# Patient Record
Sex: Female | Born: 1935
Health system: Southern US, Community
[De-identification: ages and names within clinical notes are randomized; demographics above are authoritative.]

## PROBLEM LIST (undated history)

## (undated) DIAGNOSIS — I10 Essential (primary) hypertension: Secondary | ICD-10-CM

## (undated) DIAGNOSIS — I480 Paroxysmal atrial fibrillation: Secondary | ICD-10-CM

## (undated) DIAGNOSIS — J449 Chronic obstructive pulmonary disease, unspecified: Secondary | ICD-10-CM

## (undated) DIAGNOSIS — E785 Hyperlipidemia, unspecified: Secondary | ICD-10-CM

## (undated) DIAGNOSIS — R42 Dizziness and giddiness: Secondary | ICD-10-CM

---

## 2019-06-15 ENCOUNTER — Ambulatory Visit: Payer: Self-pay | Attending: Internal Medicine

## 2019-06-15 DIAGNOSIS — Z20822 Contact with and (suspected) exposure to covid-19: Secondary | ICD-10-CM

## 2019-06-16 LAB — NOVEL CORONAVIRUS, NAA: SARS-CoV-2, NAA: NOT DETECTED

## 2019-06-16 LAB — SARS-COV-2, NAA 2 DAY TAT

## 2019-06-22 ENCOUNTER — Ambulatory Visit: Payer: Medicare Other | Attending: Internal Medicine

## 2019-06-22 DIAGNOSIS — Z23 Encounter for immunization: Secondary | ICD-10-CM

## 2019-06-22 NOTE — Progress Notes (Signed)
   Covid-19 Vaccination Clinic  Name:  Lindsey [name]    MRN: [id number] DOB: 1930/08/30  06/22/2019  Lindsey [name] was observed post Covid-19 immunization for 15 minutes .  During the observation period, she experienced an adverse reaction with the following symptoms:  nausea and Shivering.  Assessment : Time of assessment . Alert and oriented, Anxious and Shivering.No complaints of pain. Patient with son in law in attendance could not stop shaking said she felt cold, but did not fell cold. Moved patient closer to EMT for possible assistance.  EMS 1609 checked blood sugar 170, Blood pressure 178/96 manually,  HR 96 and temp 96.6.  Had a few dry heaves but no vomiting. EMS suggested she be evaluated at the hospital. Paramedic did arrive. Patient was trying to decide if she really wanted to. Felt if she could go home and get warm she would feel better. Lindsey [name] daughter arrived EMS explained the situation.  The daughter made a call to her sister who is a Engineer, civil (consulting), they both felt t was anxiety related.  The family decided to take patient home. The daughter who is a nurse will be checking on her.  The EMT reviewed with the daughter and son in law about calling 911 if there are any further symptoms.  Actions taken:  Vitals sign taken  EMS on site and hand off completed to Paramedic (Name [place]). VAERS form completed Allergy documented   Medications administered: No medication administered.  Disposition:Instructed to call 911 for trouble breathing, rapid heart rate, dizziness, swelling of tongue or throat. The Patient was provided with Vaccine Information Sheet and instruction to access the V-Safe system.  Transport with nurse in attendee to daughter car.  Immunizations Administered    Name Date Dose VIS Date Route   Pfizer COVID-19 Vaccine 06/22/2019  3:43 PM 0.3 mL 03/06/2019 Intramuscular   Manufacturer: [employer], [employer]   Lot: [id number]   NDC: [address]-7

## 2019-07-15 ENCOUNTER — Ambulatory Visit: Payer: Medicare Other

## 2019-09-30 ENCOUNTER — Emergency Department (HOSPITAL_COMMUNITY): Payer: Medicare Other

## 2019-09-30 ENCOUNTER — Other Ambulatory Visit: Payer: Self-pay

## 2019-09-30 ENCOUNTER — Emergency Department (HOSPITAL_COMMUNITY)
Admission: EM | Admit: 2019-09-30 | Discharge: 2019-09-30 | Disposition: A | Discharge status: Home / Self Care | Payer: Medicare Other | Attending: Emergency Medicine | Admitting: Emergency Medicine

## 2019-09-30 DIAGNOSIS — I10 Essential (primary) hypertension: Secondary | ICD-10-CM | POA: Diagnosis not present

## 2019-09-30 DIAGNOSIS — Y92009 Unspecified place in unspecified non-institutional (private) residence as the place of occurrence of the external cause: Secondary | ICD-10-CM | POA: Diagnosis not present

## 2019-09-30 DIAGNOSIS — Y999 Unspecified external cause status: Secondary | ICD-10-CM | POA: Insufficient documentation

## 2019-09-30 DIAGNOSIS — Z7982 Long term (current) use of aspirin: Secondary | ICD-10-CM | POA: Diagnosis not present

## 2019-09-30 DIAGNOSIS — Y939 Activity, unspecified: Secondary | ICD-10-CM | POA: Insufficient documentation

## 2019-09-30 DIAGNOSIS — N390 Urinary tract infection, site not specified: Secondary | ICD-10-CM | POA: Insufficient documentation

## 2019-09-30 DIAGNOSIS — W19XXXA Unspecified fall, initial encounter: Secondary | ICD-10-CM

## 2019-09-30 DIAGNOSIS — W2209XA Striking against other stationary object, initial encounter: Secondary | ICD-10-CM | POA: Diagnosis not present

## 2019-09-30 DIAGNOSIS — S0990XA Unspecified injury of head, initial encounter: Secondary | ICD-10-CM | POA: Diagnosis present

## 2019-09-30 LAB — CBC WITH DIFFERENTIAL/PLATELET
Abs Immature Granulocytes: 0.05 10*3/uL (ref 0.00–0.07)
Basophils Absolute: 0.1 10*3/uL (ref 0.0–0.1)
Basophils Relative: 1 %
Eosinophils Absolute: 0.2 10*3/uL (ref 0.0–0.5)
Eosinophils Relative: 2 %
HCT: 38.2 % (ref 36.0–46.0)
Hemoglobin: 12.5 g/dL (ref 12.0–15.0)
Immature Granulocytes: 1 %
Lymphocytes Relative: 25 %
Lymphs Abs: 2.4 10*3/uL (ref 0.7–4.0)
MCH: 29.4 pg (ref 26.0–34.0)
MCHC: 32.7 g/dL (ref 30.0–36.0)
MCV: 89.9 fL (ref 80.0–100.0)
Monocytes Absolute: 0.6 10*3/uL (ref 0.1–1.0)
Monocytes Relative: 6 %
Neutro Abs: 6.3 10*3/uL (ref 1.7–7.7)
Neutrophils Relative %: 65 %
Platelets: 272 10*3/uL (ref 150–400)
RBC: 4.25 MIL/uL (ref 3.87–5.11)
RDW: 12.9 % (ref 11.5–15.5)
WBC: 9.6 10*3/uL (ref 4.0–10.5)
nRBC: 0 % (ref 0.0–0.2)

## 2019-09-30 LAB — BASIC METABOLIC PANEL WITH GFR
Anion gap: 10 (ref 5–15)
BUN: 18 mg/dL (ref 8–23)
CO2: 24 mmol/L (ref 22–32)
Calcium: 9 mg/dL (ref 8.9–10.3)
Chloride: 106 mmol/L (ref 98–111)
Creatinine, Ser: 1.06 mg/dL — ABNORMAL HIGH (ref 0.44–1.00)
GFR calc Af Amer: 55 mL/min — ABNORMAL LOW
GFR calc non Af Amer: 48 mL/min — ABNORMAL LOW
Glucose, Bld: 148 mg/dL — ABNORMAL HIGH (ref 70–99)
Potassium: 3.6 mmol/L (ref 3.5–5.1)
Sodium: 140 mmol/L (ref 135–145)

## 2019-09-30 LAB — URINALYSIS, ROUTINE W REFLEX MICROSCOPIC
Bilirubin Urine: NEGATIVE
Glucose, UA: NEGATIVE mg/dL
Ketones, ur: NEGATIVE mg/dL
Nitrite: NEGATIVE
Protein, ur: NEGATIVE mg/dL
RBC / HPF: >50 RBC/hpf — ABNORMAL HIGH (ref 0–5)
Specific Gravity, Urine: 1.01 (ref 1.005–1.030)
WBC, UA: >50 WBC/hpf — ABNORMAL HIGH (ref 0–5)
pH: 5 (ref 5.0–8.0)

## 2019-09-30 MED ORDER — CEPHALEXIN 500 MG PO CAPS
500.0000 mg | ORAL_CAPSULE | Freq: Two times a day (BID) | ORAL | 0 refills | Status: AC
Start: 2019-09-30 — End: 2019-10-07

## 2019-09-30 MED ORDER — ACETAMINOPHEN 325 MG PO TABS
650.0000 mg | ORAL_TABLET | Freq: Once | ORAL | Status: AC
  Administered 2019-09-30: 650 mg via ORAL
  Filled 2019-09-30: qty 2

## 2019-09-30 NOTE — ED Notes (Signed)
Dr Judd Lien at beside.

## 2019-09-30 NOTE — ED Notes (Signed)
Pt. Stated while she was in CT they removed her necklace and did not give it back. This tech called CT and spoke with [place] in CT. [place] from CT states the only thing removed from the patient was the earrings which were given to the patient's daughter in the room.

## 2019-09-30 NOTE — ED Notes (Signed)
Pt returned for CT. Dtr at bedside

## 2019-09-30 NOTE — Discharge Instructions (Signed)
Take the antibiotics as directed.  We have sent your urine for culture.  If you need to be on a different antibiotic we will call you and let you know. Follow-up with your primary care provider. Continue your home medications as prescribed. Return to the ER for worsening headache, additional injuries or falls, if you develop chest pain, shortness of breath, leg swelling, numbness in arms or legs.

## 2019-09-30 NOTE — ED Notes (Signed)
Pt made aware of the need for urine.

## 2019-09-30 NOTE — ED Notes (Signed)
Pt has been discharged but had to wait on security to place a report on a lost necklace. Pt wheeled to car after report placed, pt in NAD at this time and departing with daughter

## 2019-09-30 NOTE — ED Triage Notes (Signed)
Pt BIB [facility] from [facility] assisted living. Another resident saw pt fall and hit head on wooden railing. Pt reports feeling dizzy before fall.

## 2019-09-30 NOTE — ED Provider Notes (Addendum)
[facility] EMERGENCY DEPARTMENT Provider Note   CSN: [id number] Arrival date & time: 09/30/19  1359     History Chief Complaint  Patient presents with  . Fall    Lindsey [name] is a 86 y.o. female with a past medical history of atrial fibrillation, hypertension, vertigo who presents to ED with a chief complaint of fall.  Patient states that she has vertigo at baseline which causes her to be dizzy.  States that she was ambulating with a walker, felt dizzy, fell and hit the back of her head.  She denies any loss of consciousness.  She reports headache to the back of her head where she struck her head on a wooden railing.  She denies any chest pain, abdominal pain, vomiting, vision changes, numbness in arms or legs, changes to gait.  States that she recently moved to [place] from "up [place]."  HPI     No past medical history on file.  There are no problems to display for this patient.   OB History   No obstetric history on file.     No family history on file.  Social History   Tobacco Use  . Smoking status: Not on file  Substance Use Topics  . Alcohol use: Not on file  . Drug use: Not on file    Home Medications Prior to Admission medications   Medication Sig Start Date End Date Taking? Authorizing Provider  aspirin 81 MG chewable tablet Chew 81 mg by mouth daily.   Yes [provider]  atorvastatin (LIPITOR) 10 MG tablet Take 10 mg by mouth daily.   Yes [provider]  diclofenac Sodium (VOLTAREN) 1 % GEL Apply 2 g topically daily as needed (pain).   Yes [provider]  docusate sodium (COLACE) 100 MG capsule Take 100 mg by mouth every other day.   Yes [provider]  DULoxetine (CYMBALTA) 60 MG capsule Take 60 mg by mouth daily.   Yes [provider]  furosemide (LASIX) 40 MG tablet Take 40 mg by mouth daily.   Yes [provider]  gabapentin (NEURONTIN) 100 MG capsule Take 100 mg by mouth  3 (three) times daily.   Yes [provider]  meclizine (ANTIVERT) 25 MG tablet Take 25 mg by mouth every 8 (eight) hours as needed (motion sickness).   Yes [provider]  metFORMIN (GLUCOPHAGE) 500 MG tablet Take 500 mg by mouth in the morning and at bedtime.   Yes [provider]  metoprolol succinate (TOPROL-XL) 100 MG 24 hr tablet Take 100 mg by mouth daily. Take with or immediately following a meal.   Yes [provider]  Multiple Vitamin (MULTIVITAMIN WITH MINERALS) TABS tablet Take 1 tablet by mouth daily.   Yes [provider]  pantoprazole (PROTONIX) 40 MG tablet Take 40 mg by mouth daily.   Yes [provider]  potassium chloride (KLOR-CON) 10 MEQ tablet Take 10 mEq by mouth every Monday, Wednesday, and Friday.   Yes [provider]  rivaroxaban (XARELTO) 20 MG TABS tablet Take 20 mg by mouth daily.   Yes [provider]  SUMAtriptan (IMITREX) 50 MG tablet Take 50 mg by mouth every 2 (two) hours as needed for migraine. May repeat in 2 hours if headache persists or recurs.   Yes [provider]  tamsulosin (FLOMAX) 0.4 MG CAPS capsule Take 0.4 mg by mouth daily.   Yes [provider]  tiZANidine (ZANAFLEX) 2 MG tablet Take  2 mg by mouth daily.   Yes [provider]  cephALEXin (KEFLEX) 500 MG capsule Take 1 capsule (500 mg total) by mouth 2 (two) times daily for 7 days. 09/30/19 10/07/19  Khaylee Mcevoy, Hillary Bow, PA-C    Allergies    Diazepam, Drug ingredient [monosodium glutamate], Iodinated diagnostic agents, Nitrofurantoin, Pineapple, Savella [milnacipran], Shellfish allergy, Sulfa antibiotics, and Tramadol  Review of Systems   Review of Systems  Constitutional: Negative for appetite change, chills and fever.  HENT: Negative for ear pain, rhinorrhea, sneezing and sore throat.   Eyes: Negative for photophobia and visual disturbance.  Respiratory: Negative for cough, chest tightness, shortness of  breath and wheezing.   Cardiovascular: Negative for chest pain and palpitations.  Gastrointestinal: Negative for abdominal pain, blood in stool, constipation, diarrhea, nausea and vomiting.  Genitourinary: Negative for dysuria, hematuria and urgency.  Musculoskeletal: Negative for myalgias.  Skin: Negative for rash.  Neurological: Positive for dizziness and headaches. Negative for weakness and light-headedness.    Physical Exam Updated Vital Signs BP (!) 180/100   Pulse (!) 57   Temp 98.5 F (36.9 C) (Oral)   Resp 19   Ht 5\' 7"  (1.702 m)   Wt 71.7 kg   SpO2 95%   BMI 24.75 kg/m   Physical Exam Vitals and nursing note reviewed.  Constitutional:      General: She is not in acute distress.    Appearance: She is well-developed.  HENT:     Head: Normocephalic.      Nose: Nose normal.  Eyes:     General: No scleral icterus.       Right eye: No discharge.        Left eye: No discharge.     Conjunctiva/sclera: Conjunctivae normal.     Pupils: Pupils are equal, round, and reactive to light.  Cardiovascular:     Rate and Rhythm: Normal rate and regular rhythm.     Heart sounds: Normal heart sounds. No murmur heard.  No friction rub. No gallop.   Pulmonary:     Effort: Pulmonary effort is normal. No respiratory distress.     Breath sounds: Normal breath sounds.  Abdominal:     General: Bowel sounds are normal. There is no distension.     Palpations: Abdomen is soft.     Tenderness: There is no abdominal tenderness. There is no guarding.  Musculoskeletal:        General: Normal range of motion.     Cervical back: Normal range of motion and neck supple.     Comments: No midline spinal tenderness present in lumbar, thoracic or cervical spine. No step-off palpated. No visible bruising, edema or temperature change noted. No objective signs of numbness present. No saddle anesthesia. 2+ DP pulses bilaterally. Sensation intact to light touch. Strength 5/5 in bilateral lower  extremities.  Skin:    General: Skin is warm and dry.     Findings: No rash.  Neurological:     General: No focal deficit present.     Mental Status: She is alert and oriented to person, place, and time.     Cranial Nerves: No cranial nerve deficit.     Motor: No abnormal muscle tone.     Coordination: Coordination normal.     ED Results / Procedures / Treatments   Labs (all labs ordered are listed, but only abnormal results are displayed) Labs Reviewed  BASIC METABOLIC PANEL - Abnormal; Notable for the following components:      Result Value  Glucose, Bld 148 (*)    Creatinine, Ser 1.06 (*)    GFR calc non Af Amer 48 (*)    GFR calc Af Amer 55 (*)    All other components within normal limits  URINALYSIS, ROUTINE W REFLEX MICROSCOPIC - Abnormal; Notable for the following components:   APPearance HAZY (*)    Hgb urine dipstick LARGE (*)    Leukocytes,Ua LARGE (*)    RBC / HPF >50 (*)    WBC, UA >50 (*)    Bacteria, UA RARE (*)    All other components within normal limits  URINE CULTURE  CBC WITH DIFFERENTIAL/PLATELET    EKG EKG Interpretation  Date/Time:  Wednesday September 30 2019 16:44:19 EDT Ventricular Rate:  109 PR Interval:    QRS Duration: 106 QT Interval:  328 QTC Calculation: 442 R Axis:   -13 Text Interpretation: Atrial fibrillation LVH with secondary repolarization abnormality No previous ECGs available Confirmed by Vanetta MuldersZackowski, Scott [phone]([id number]) on 09/30/2019 5:00:01 PM   Radiology CT Head Wo Contrast  Result Date: 09/30/2019 CLINICAL DATA:  Fall, blood thinners, hit head on railing EXAM: CT HEAD WITHOUT CONTRAST CT CERVICAL SPINE WITHOUT CONTRAST TECHNIQUE: Multidetector CT imaging of the head and cervical spine was performed following the standard protocol without intravenous contrast. Multiplanar CT image reconstructions of the cervical spine were also generated. COMPARISON:  None. FINDINGS: CT HEAD FINDINGS Brain: No evidence of acute infarction, hemorrhage,  hydrocephalus, extra-axial collection or mass lesion/mass effect. Periventricular and deep white matter hypodensity. Vascular: No hyperdense vessel or unexpected calcification. Skull: Normal. Negative for fracture or focal lesion. Sinuses/Orbits: No acute finding. Other: Soft tissue contusion of the right scalp vertex. CT CERVICAL SPINE FINDINGS Alignment: Normal. Skull base and vertebrae: No acute fracture. No primary bone lesion or focal pathologic process. Soft tissues and spinal canal: No prevertebral fluid or swelling. No visible canal hematoma. Disc levels: Moderate multilevel disc space height loss and osteophytosis. Upper chest: Negative. Other: None. IMPRESSION: 1. No acute intracranial pathology. Small-vessel white matter disease. 2. Soft tissue contusion of the right scalp vertex. 3. No fracture or static subluxation of the cervical spine. 4. Moderate multilevel disc degenerative disease of the cervical spine. Electronically Signed   By: Lauralyn PrimesAlex  Bibbey M.D.   On: 09/30/2019 14:59   CT Cervical Spine Wo Contrast  Result Date: 09/30/2019 CLINICAL DATA:  Fall, blood thinners, hit head on railing EXAM: CT HEAD WITHOUT CONTRAST CT CERVICAL SPINE WITHOUT CONTRAST TECHNIQUE: Multidetector CT imaging of the head and cervical spine was performed following the standard protocol without intravenous contrast. Multiplanar CT image reconstructions of the cervical spine were also generated. COMPARISON:  None. FINDINGS: CT HEAD FINDINGS Brain: No evidence of acute infarction, hemorrhage, hydrocephalus, extra-axial collection or mass lesion/mass effect. Periventricular and deep white matter hypodensity. Vascular: No hyperdense vessel or unexpected calcification. Skull: Normal. Negative for fracture or focal lesion. Sinuses/Orbits: No acute finding. Other: Soft tissue contusion of the right scalp vertex. CT CERVICAL SPINE FINDINGS Alignment: Normal. Skull base and vertebrae: No acute fracture. No primary bone lesion or  focal pathologic process. Soft tissues and spinal canal: No prevertebral fluid or swelling. No visible canal hematoma. Disc levels: Moderate multilevel disc space height loss and osteophytosis. Upper chest: Negative. Other: None. IMPRESSION: 1. No acute intracranial pathology. Small-vessel white matter disease. 2. Soft tissue contusion of the right scalp vertex. 3. No fracture or static subluxation of the cervical spine. 4. Moderate multilevel disc degenerative disease of the cervical spine. Electronically Signed  By: Lauralyn Primes M.D.   On: 09/30/2019 14:59   DG Pelvis Portable  Result Date: 09/30/2019 CLINICAL DATA:  Right hip and pelvic pain after fall. EXAM: PORTABLE PELVIS 1-2 VIEWS COMPARISON:  None. FINDINGS: The cortical margins of the bony pelvis are intact. No fracture. Pubic symphysis and sacroiliac joints are congruent. Both femoral heads are well-seated in the respective acetabula. Surgical hardware in the lower lumbar spine is partially included. IMPRESSION: No evidence of pelvic fracture. Electronically Signed   By: Narda Rutherford M.D.   On: 09/30/2019 18:10    Procedures Procedures (including critical care time)  Medications Ordered in ED Medications  acetaminophen (TYLENOL) tablet 650 mg (650 mg Oral Given 09/30/19 1836)    ED Course  I have reviewed the triage vital signs and the nursing notes.  Pertinent labs & imaging results that were available during my care of the patient were reviewed by me and considered in my medical decision making (see chart for details).    MDM Rules/Calculators/A&P                          86 year old female with a past medical history of atrial fibrillation presenting to the ED after fall that occurred prior to arrival.  States that she has vertigo at baseline.  She was feeling dizzy while she was ambulating with her walker prior to arrival, fell and hit the back of her head.  No loss of consciousness reported.  She has been having a headache  since then the area where she struck her head on a wooden railing.  She denies chest pain, vision changes, numbness in arms or legs or changes to gait.  Daughter at bedside states that she was recently diagnosed with atrial fibrillation and placed on Xarelto.  On exam patient is alert, oriented to self, place and situation.  She is hard of hearing but is able to read lips.  No tenderness of the C, T or L-spine midline.  Patient initially denying any pain to the bilateral hips.  No deformities noted.  Distal pulses are intact.  There is a hematoma to the back of her head where she is having pain.  CT of the head and cervical spine showed no acute abnormalities.  EKG shows A. fib.  BMP, CBC unremarkable.  Urinalysis does show evidence of a UTI with bacteria, leukocytes and pyuria.  Patient reports she has a history of frequent UTIs and would like to be treated.  On my recheck she is now complaining of right hip pain with transport.  No deformities again noted.  Pelvic x-ray showed no acute abnormalities.  Patient symptoms controlled here with Tylenol.  Daughter at bedside states that she is at her mental baseline.  Will discharge her home with treatment for UTI and instructions regarding following up on urine culture and head injury precautions.  Patient is comfortable with plan. Patient discussed with and seen by the attending, Dr. Judd Lien.  Patient is hemodynamically stable, in NAD, and able to ambulate in the ED. Evaluation does not show pathology that would require ongoing emergent intervention or inpatient treatment. I explained the diagnosis to the patient. Pain has been managed and has no complaints prior to discharge. Patient is comfortable with above plan and is stable for discharge at this time. All questions were answered prior to disposition. Strict return precautions for returning to the ED were discussed. Encouraged follow up with PCP.   An After Visit Summary  was printed and given to the  patient.   Portions of this note were generated with Scientist, clinical (histocompatibility and immunogenetics). Dictation errors may occur despite best attempts at proofreading.  Final Clinical Impression(s) / ED Diagnoses Final diagnoses:  Fall, initial encounter  Injury of head, initial encounter  Lower urinary tract infectious disease    Rx / DC Orders ED Discharge Orders         Ordered    cephALEXin (KEFLEX) 500 MG capsule  2 times daily     Discontinue  Reprint     09/30/19 1837               Dietrich Pates, PA-C 09/30/19 Reece Agar, MD 10/02/19 2308

## 2019-10-02 LAB — URINE CULTURE: Culture: >=100000 — AB

## 2019-10-03 ENCOUNTER — Telehealth: Payer: Self-pay | Admitting: Emergency Medicine

## 2019-10-03 NOTE — Telephone Encounter (Signed)
Post ED Visit - Positive Culture Follow-up  Culture report reviewed by antimicrobial stewardship pharmacist: [facility] []  , Pharm.D. []  Enzo Bi, Pharm.D., BCPS AQ-ID []  , Pharm.D., BCPS []  Celedonio Miyamoto, Pharm.D., BCPS []  [place], Garvin Fila.D., BCPS, AAHIVP []  , Pharm.D., BCPS, AAHIVP []  Georgina Pillion, PharmD, BCPS []  , PharmD, BCPS []  [place], PharmD, BCPS []  [address], PharmD []  , PharmD, BCPS [x]  Estella Husk, PharmD  Pharmacy Team []  Lysle Pearl, PharmD []  , PharmD []  Phillips Climes, PharmD []  , Rph []  Agapito Games) , PharmD []  Verlan Friends, PharmD []  , PharmD []  Mervyn Gay, PharmD []  , PharmD []  Vinnie Level, PharmD []  [facility], PharmD []  , PharmD []  Len Childs, PharmD   Positive urine culture Treated with Cephalexin, organism sensitive to the same and no further patient follow-up is required at this time.  10/03/2019, 12:38 PM

## 2019-11-15 ENCOUNTER — Emergency Department (HOSPITAL_COMMUNITY): Payer: Medicare Other

## 2019-11-15 ENCOUNTER — Other Ambulatory Visit: Payer: Self-pay

## 2019-11-15 ENCOUNTER — Emergency Department (HOSPITAL_COMMUNITY)
Admission: EM | Admit: 2019-11-15 | Discharge: 2019-11-15 | Disposition: A | Discharge status: Home / Self Care | Payer: Medicare Other | Attending: Emergency Medicine | Admitting: Emergency Medicine

## 2019-11-15 ENCOUNTER — Encounter (HOSPITAL_COMMUNITY): Payer: Self-pay

## 2019-11-15 DIAGNOSIS — Z20822 Contact with and (suspected) exposure to covid-19: Secondary | ICD-10-CM | POA: Insufficient documentation

## 2019-11-15 DIAGNOSIS — N390 Urinary tract infection, site not specified: Secondary | ICD-10-CM | POA: Insufficient documentation

## 2019-11-15 DIAGNOSIS — R531 Weakness: Secondary | ICD-10-CM | POA: Diagnosis present

## 2019-11-15 HISTORY — DX: Dizziness and giddiness: R42

## 2019-11-15 HISTORY — DX: Essential (primary) hypertension: I10

## 2019-11-15 LAB — URINALYSIS, ROUTINE W REFLEX MICROSCOPIC
Bilirubin Urine: NEGATIVE
Glucose, UA: NEGATIVE mg/dL
Ketones, ur: NEGATIVE mg/dL
Nitrite: NEGATIVE
Protein, ur: 30 mg/dL — AB
Specific Gravity, Urine: 1.012 (ref 1.005–1.030)
pH: 5 (ref 5.0–8.0)

## 2019-11-15 LAB — CBC
HCT: 33.1 % — ABNORMAL LOW (ref 36.0–46.0)
Hemoglobin: 11 g/dL — ABNORMAL LOW (ref 12.0–15.0)
MCH: 29 pg (ref 26.0–34.0)
MCHC: 33.2 g/dL (ref 30.0–36.0)
MCV: 87.3 fL (ref 80.0–100.0)
Platelets: 203 10*3/uL (ref 150–400)
RBC: 3.79 MIL/uL — ABNORMAL LOW (ref 3.87–5.11)
RDW: 14.3 % (ref 11.5–15.5)
WBC: 11.9 10*3/uL — ABNORMAL HIGH (ref 4.0–10.5)
nRBC: 0 % (ref 0.0–0.2)

## 2019-11-15 LAB — COMPREHENSIVE METABOLIC PANEL
ALT: 22 U/L (ref 0–44)
AST: 40 U/L (ref 15–41)
Albumin: 3.9 g/dL (ref 3.5–5.0)
Alkaline Phosphatase: 48 U/L (ref 38–126)
Anion gap: 13 (ref 5–15)
BUN: 35 mg/dL — ABNORMAL HIGH (ref 8–23)
CO2: 21 mmol/L — ABNORMAL LOW (ref 22–32)
Calcium: 8.5 mg/dL — ABNORMAL LOW (ref 8.9–10.3)
Chloride: 100 mmol/L (ref 98–111)
Creatinine, Ser: 1.12 mg/dL — ABNORMAL HIGH (ref 0.44–1.00)
GFR calc Af Amer: 51 mL/min — ABNORMAL LOW (ref >60)
GFR calc non Af Amer: 44 mL/min — ABNORMAL LOW (ref >60)
Glucose, Bld: 266 mg/dL — ABNORMAL HIGH (ref 70–99)
Potassium: 3.5 mmol/L (ref 3.5–5.1)
Sodium: 134 mmol/L — ABNORMAL LOW (ref 135–145)
Total Bilirubin: 1 mg/dL (ref 0.3–1.2)
Total Protein: 7.4 g/dL (ref 6.5–8.1)

## 2019-11-15 LAB — SARS CORONAVIRUS 2 BY RT PCR (HOSPITAL ORDER, PERFORMED IN ~~LOC~~ HOSPITAL LAB): SARS Coronavirus 2: NEGATIVE

## 2019-11-15 LAB — LACTIC ACID, PLASMA: Lactic Acid, Venous: 2.3 mmol/L (ref 0.5–1.9)

## 2019-11-15 MED ORDER — SODIUM CHLORIDE 0.9 % IV BOLUS
500.0000 mL | Freq: Once | INTRAVENOUS | Status: AC
  Administered 2019-11-15: 500 mL via INTRAVENOUS

## 2019-11-15 MED ORDER — SODIUM CHLORIDE 0.9 % IV BOLUS
1000.0000 mL | Freq: Once | INTRAVENOUS | Status: AC
  Administered 2019-11-15: 1000 mL via INTRAVENOUS

## 2019-11-15 MED ORDER — CEPHALEXIN 500 MG PO CAPS: 1000.0000 mg | ORAL_CAPSULE | Freq: Two times a day (BID) | ORAL | 0 refills | Status: DC

## 2019-11-15 MED ORDER — SODIUM CHLORIDE 0.9 % IV SOLN
1.0000 g | Freq: Once | INTRAVENOUS | Status: AC
  Administered 2019-11-15: 1 g via INTRAVENOUS
  Filled 2019-11-15: qty 10

## 2019-11-15 NOTE — ED Provider Notes (Signed)
[facility]-EMERGENCY DEPT Provider Note   CSN: [id number] Arrival date & time: 11/15/19  1334     History Chief Complaint  Patient presents with  . Weakness    Lindsey [name] is a 86 y.o. female.  Patient arrives to ED via EMS from [facility] with report patient generally weak, less alert/active than normal, occasional cough, non productive, and arrives with low grade fever, 100. No specific known covid exposure. Pt very limited historian - does not answer questions asked - level 5 caveat. No report of trauma or fall.   The history is provided by the patient and the EMS personnel. The history is limited by the condition of the patient.       Past Medical History:  Diagnosis Date  . Hypertension   . Vertigo     There are no problems to display for this patient.   History reviewed. No pertinent surgical history.   OB History   No obstetric history on file.     History reviewed. No pertinent family history.  Social History   Tobacco Use  . Smoking status: Not on file  Substance Use Topics  . Alcohol use: Not on file  . Drug use: Not on file    Home Medications Prior to Admission medications   Medication Sig Start Date End Date Taking? Authorizing Provider  aspirin 81 MG chewable tablet Chew 81 mg by mouth daily.    [provider]  atorvastatin (LIPITOR) 10 MG tablet Take 10 mg by mouth daily.    [provider]  diclofenac Sodium (VOLTAREN) 1 % GEL Apply 2 g topically daily as needed (pain).    [provider]  docusate sodium (COLACE) 100 MG capsule Take 100 mg by mouth every other day.    [provider]  DULoxetine (CYMBALTA) 60 MG capsule Take 60 mg by mouth daily.    [provider]  furosemide (LASIX) 40 MG tablet Take 40 mg by mouth daily.    [provider]  gabapentin (NEURONTIN) 100 MG capsule Take 100 mg by mouth 3 (three) times daily.    [provider]  meclizine  (ANTIVERT) 25 MG tablet Take 25 mg by mouth every 8 (eight) hours as needed (motion sickness).    [provider]  metFORMIN (GLUCOPHAGE) 500 MG tablet Take 500 mg by mouth in the morning and at bedtime.    [provider]  metoprolol succinate (TOPROL-XL) 100 MG 24 hr tablet Take 100 mg by mouth daily. Take with or immediately following a meal.    [provider]  Multiple Vitamin (MULTIVITAMIN WITH MINERALS) TABS tablet Take 1 tablet by mouth daily.    [provider]  pantoprazole (PROTONIX) 40 MG tablet Take 40 mg by mouth daily.    [provider]  potassium chloride (KLOR-CON) 10 MEQ tablet Take 10 mEq by mouth every Monday, Wednesday, and Friday.    [provider]  rivaroxaban (XARELTO) 20 MG TABS tablet Take 20 mg by mouth daily.    [provider]  SUMAtriptan (IMITREX) 50 MG tablet Take 50 mg by mouth every 2 (two) hours as needed for migraine. May repeat in 2 hours if headache persists or recurs.    [provider]  tamsulosin (FLOMAX) 0.4 MG CAPS capsule Take 0.4 mg by mouth daily.    [provider]  tiZANidine (ZANAFLEX) 2 MG tablet Take 2 mg by mouth daily.    [provider]    Allergies  Diazepam, Drug ingredient [monosodium glutamate], Iodinated diagnostic agents, Nitrofurantoin, Pineapple, Savella [milnacipran], Shellfish allergy, Sulfa antibiotics, and Tramadol  Review of Systems   Review of Systems  Unable to perform ROS: Mental status change  Allergic/Immunologic: Immunocompromised state:    level 5 caveat, dementia, altered mental status  Physical Exam Updated Vital Signs BP 105/66 (BP Location: Left Arm)   Pulse 99   Temp 100 F (37.8 C) (Rectal)   Resp 20   Ht 1.702 m (5\' 7" )   SpO2 98%   BMI 24.75 kg/m   Physical Exam Vitals and nursing note reviewed.  Constitutional:      Appearance: Normal appearance. She is well-developed.  HENT:     Head: Atraumatic.      Nose: Nose normal.     Mouth/Throat:     Mouth: Mucous membranes are moist.  Eyes:     General: No scleral icterus.    Conjunctiva/sclera: Conjunctivae normal.     Pupils: Pupils are equal, round, and reactive to light.  Neck:     Vascular: No carotid bruit.     Trachea: No tracheal deviation.     Comments: No stiffness or rigidity. Thyroid not grossly enlarged or tender. Trachea midline.  Cardiovascular:     Rate and Rhythm: Normal rate and regular rhythm.     Pulses: Normal pulses.     Heart sounds: Normal heart sounds. No murmur heard.  No friction rub. No gallop.   Pulmonary:     Effort: Pulmonary effort is normal. No respiratory distress.     Breath sounds: Normal breath sounds.  Abdominal:     General: Bowel sounds are normal. There is no distension.     Palpations: Abdomen is soft. There is no mass.     Tenderness: There is no abdominal tenderness. There is no guarding or rebound.     Hernia: No hernia is present.  Genitourinary:    Comments: No cva tenderness.  Musculoskeletal:        General: No swelling or tenderness.     Cervical back: Normal range of motion and neck supple. No rigidity or tenderness. No muscular tenderness.  Lymphadenopathy:     Cervical: No cervical adenopathy.  Skin:    General: Skin is warm and dry.     Findings: No rash.  Neurological:     Mental Status: She is alert.     Comments: Sitting upright. Eyes closed. Opens eyes to command. Moves bil extremities purposefully.   Psychiatric:     Comments: General weak, drowsy appearing.      ED Results / Procedures / Treatments   Labs (all labs ordered are listed, but only abnormal results are displayed) Results for orders placed or performed during the hospital encounter of 11/15/19  SARS Coronavirus 2 by RT PCR (hospital order, performed in [facility] hospital lab) Nasopharyngeal Nasopharyngeal Swab   Specimen: Nasopharyngeal Swab  Result Value Ref Range   SARS Coronavirus 2 NEGATIVE  NEGATIVE  CBC  Result Value Ref Range   WBC 11.9 (H) 4.0 - 10.5 K/uL   RBC 3.79 (L) 3.87 - 5.11 MIL/uL   Hemoglobin 11.0 (L) 12.0 - 15.0 g/dL   HCT [facility] (L) 36 - 46 %   MCV 87.3 80.0 - 100.0 fL   MCH 29.0 26.0 - 34.0 pg   MCHC 33.2 30.0 - 36.0 g/dL   RDW 36.1 44.3 - 15.4 %   Platelets 203 150 - 400 K/uL   nRBC 0.0 0.0 - 0.2 %  Comprehensive metabolic panel  Result Value Ref Range   Sodium 134 (L) 135 - 145 mmol/L   Potassium 3.5 3.5 - 5.1 mmol/L   Chloride 100 98 - 111 mmol/L   CO2 21 (L) 22 - 32 mmol/L   Glucose, Bld 266 (H) 70 - 99 mg/dL   BUN 35 (H) 8 - 23 mg/dL   Creatinine, Ser 1.611.12 (H) 0.44 - 1.00 mg/dL   Calcium 8.5 (L) 8.9 - 10.3 mg/dL   Total Protein 7.4 6.5 - 8.1 g/dL   Albumin 3.9 3.5 - 5.0 g/dL   AST 40 15 - 41 U/L   ALT 22 0 - 44 U/L   Alkaline Phosphatase 48 38 - 126 U/L   Total Bilirubin 1.0 0.3 - 1.2 mg/dL   GFR calc non Af Amer 44 (L) >60 mL/min   GFR calc Af Amer 51 (L) >60 mL/min   Anion gap 13 5 - 15  Urinalysis, Routine w reflex microscopic Urine, Catheterized  Result Value Ref Range   Color, Urine YELLOW YELLOW   APPearance CLEAR CLEAR   Specific Gravity, Urine 1.012 1.005 - 1.030   pH 5.0 5.0 - 8.0   Glucose, UA NEGATIVE NEGATIVE mg/dL   Hgb urine dipstick MODERATE (A) NEGATIVE   Bilirubin Urine NEGATIVE NEGATIVE   Ketones, ur NEGATIVE NEGATIVE mg/dL   Protein, ur 30 (A) NEGATIVE mg/dL   Nitrite NEGATIVE NEGATIVE   Leukocytes,Ua MODERATE (A) NEGATIVE   RBC / HPF 0-5 0 - 5 RBC/hpf   WBC, UA 21-50 0 - 5 WBC/hpf   Bacteria, UA MANY (A) NONE SEEN   Hyaline Casts, UA PRESENT    Granular Casts, UA PRESENT   Lactic acid, plasma  Result Value Ref Range   Lactic Acid, Venous 2.3 (HH) 0.5 - 1.9 mmol/L   DG Chest Port 1 View  Result Date: 11/15/2019 CLINICAL DATA:  Fever, lethargy EXAM: PORTABLE CHEST 1 VIEW COMPARISON:  None. FINDINGS: Single frontal view of the chest demonstrates an unremarkable cardiac silhouette. No airspace disease, effusion,  or pneumothorax. No acute bony abnormalities. IMPRESSION: 1. No acute intrathoracic process. Electronically Signed   By: Sharlet SalinaMichael  Brown M.D.   On: 11/15/2019 15:05    EKG None  [facility]  Result Date: 11/15/2019 CLINICAL DATA:  Fever, lethargy EXAM: PORTABLE CHEST 1 VIEW COMPARISON:  None. FINDINGS: Single frontal view of the chest demonstrates an unremarkable cardiac silhouette. No airspace disease, effusion, or pneumothorax. No acute bony abnormalities. IMPRESSION: 1. No acute intrathoracic process. Electronically Signed   By: Sharlet SalinaMichael  Brown M.D.   On: 11/15/2019 15:05    Procedures Procedures (including critical care time)  Medications Ordered in ED Medications - No data to display  ED Course  I have reviewed the triage vital signs and the nursing notes.  Pertinent labs & imaging results that were available during my care of the patient were reviewed by me and considered in my medical decision making (see chart for details).    MDM Rules/Calculators/A&P                         Labs sent. Cxr.   Reviewed nursing notes and prior charts for additional history.   Covid swab sent.  Marilynne DriversRosette Harm was evaluated in Emergency Department on 11/15/2019 for the symptoms described in the history of present illness. She was evaluated in the context of the global COVID-19 pandemic, which necessitated consideration that the patient might be at risk  for infection with the SARS-CoV-2 virus that causes COVID-19. Institutional protocols and algorithms that pertain to the evaluation of patients at risk for COVID-19 are in a state of rapid change based on information released by regulatory bodies including the CDC and federal and state organizations. These policies and algorithms were followed during the patient's care in the ED.  CXR reviewed/interpreted by me - no pna.  Initial labs reviewed/interpreted by me - lactate is sl high. Iv ns bolus. Wbc sl elev.   UA and covid  pending.   Additional labs reviewed/interpreted by me - covid is negative. UA positive for UTI - urine culture sent. Iv antibiotics given.   Recheck, pt alert, content, and appears in no acute distres. BP normal, Hr is 82, rr 18, pulse ox 98% (while in room with pt @ 1830). Abd soft nt. No vomiting.   Pt currently appears stable for d/c.   Rx provided.   Return precautions provided.     Final Clinical Impression(s) / ED Diagnoses Final diagnoses:  None    Rx / DC Orders ED Discharge Orders    None       Cathren Laine, MD 11/15/19 [phone]

## 2019-11-15 NOTE — ED Triage Notes (Signed)
Staff t her extended care facility and pt's. Family noted pt. To be "less responsive today". Pt. Arrives in no distress, awake and non-verbal. Her skin is slightly pale, warm and dry.

## 2019-11-15 NOTE — Discharge Instructions (Addendum)
It was our pleasure to provide your ER care today - we hope that you feel better.  Your lab work shows a urine infection - a urine culture was sent the results of which will be back in 1-2 days - have your doctor follow up on those results then.   Take antibiotic (keflex) as prescribed.   Drink plenty of fluids. Get adequate nutrition.   Follow up with primary care doctor in the next 1-2 days for recheck.   Return to ER if worse, new symptoms, increased trouble breathing, new or severe pain, persistent vomiting, or other concern.

## 2019-11-15 NOTE — ED Notes (Signed)
[facility] called and transport request made.

## 2019-11-15 NOTE — ED Notes (Signed)
She is aware of her situation, and now answers simple questions appropriately if they are spoken loudly enough.

## 2019-11-15 NOTE — ED Notes (Signed)
Mardene Celeste, daughter, [phone], would like a call when her mom gets in a room and she can visit.

## 2019-11-16 LAB — URINE CULTURE

## 2019-11-22 ENCOUNTER — Other Ambulatory Visit: Payer: Self-pay

## 2019-11-22 ENCOUNTER — Inpatient Hospital Stay (HOSPITAL_COMMUNITY)
Admission: EM | Admit: 2019-11-22 | Discharge: 2019-11-27 | DRG: 871 | Disposition: A | Discharge status: Skilled Nursing Facility | Payer: Medicare Other | Source: Skilled Nursing Facility | Attending: Internal Medicine | Admitting: Internal Medicine

## 2019-11-22 ENCOUNTER — Encounter (HOSPITAL_COMMUNITY): Payer: Self-pay | Admitting: *Deleted

## 2019-11-22 ENCOUNTER — Emergency Department (HOSPITAL_COMMUNITY): Payer: Medicare Other

## 2019-11-22 DIAGNOSIS — R0602 Shortness of breath: Secondary | ICD-10-CM

## 2019-11-22 DIAGNOSIS — E785 Hyperlipidemia, unspecified: Secondary | ICD-10-CM | POA: Diagnosis present

## 2019-11-22 DIAGNOSIS — Z91041 Radiographic dye allergy status: Secondary | ICD-10-CM

## 2019-11-22 DIAGNOSIS — Z20822 Contact with and (suspected) exposure to covid-19: Secondary | ICD-10-CM | POA: Diagnosis present

## 2019-11-22 DIAGNOSIS — R627 Adult failure to thrive: Secondary | ICD-10-CM | POA: Diagnosis not present

## 2019-11-22 DIAGNOSIS — Z888 Allergy status to other drugs, medicaments and biological substances status: Secondary | ICD-10-CM

## 2019-11-22 DIAGNOSIS — Z955 Presence of coronary angioplasty implant and graft: Secondary | ICD-10-CM

## 2019-11-22 DIAGNOSIS — E1165 Type 2 diabetes mellitus with hyperglycemia: Secondary | ICD-10-CM | POA: Diagnosis present

## 2019-11-22 DIAGNOSIS — J209 Acute bronchitis, unspecified: Secondary | ICD-10-CM | POA: Diagnosis not present

## 2019-11-22 DIAGNOSIS — Z91013 Allergy to seafood: Secondary | ICD-10-CM

## 2019-11-22 DIAGNOSIS — Z7982 Long term (current) use of aspirin: Secondary | ICD-10-CM

## 2019-11-22 DIAGNOSIS — Z886 Allergy status to analgesic agent status: Secondary | ICD-10-CM

## 2019-11-22 DIAGNOSIS — I252 Old myocardial infarction: Secondary | ICD-10-CM

## 2019-11-22 DIAGNOSIS — Z7984 Long term (current) use of oral hypoglycemic drugs: Secondary | ICD-10-CM

## 2019-11-22 DIAGNOSIS — I2510 Atherosclerotic heart disease of native coronary artery without angina pectoris: Secondary | ICD-10-CM | POA: Diagnosis present

## 2019-11-22 DIAGNOSIS — Z79899 Other long term (current) drug therapy: Secondary | ICD-10-CM

## 2019-11-22 DIAGNOSIS — R6510 Systemic inflammatory response syndrome (SIRS) of non-infectious origin without acute organ dysfunction: Secondary | ICD-10-CM | POA: Diagnosis present

## 2019-11-22 DIAGNOSIS — J440 Chronic obstructive pulmonary disease with acute lower respiratory infection: Secondary | ICD-10-CM | POA: Diagnosis present

## 2019-11-22 DIAGNOSIS — Z66 Do not resuscitate: Secondary | ICD-10-CM | POA: Diagnosis present

## 2019-11-22 DIAGNOSIS — I5043 Acute on chronic combined systolic (congestive) and diastolic (congestive) heart failure: Secondary | ICD-10-CM | POA: Diagnosis present

## 2019-11-22 DIAGNOSIS — R079 Chest pain, unspecified: Secondary | ICD-10-CM | POA: Diagnosis present

## 2019-11-22 DIAGNOSIS — Z87891 Personal history of nicotine dependence: Secondary | ICD-10-CM

## 2019-11-22 DIAGNOSIS — I429 Cardiomyopathy, unspecified: Secondary | ICD-10-CM | POA: Diagnosis present

## 2019-11-22 DIAGNOSIS — Z885 Allergy status to narcotic agent status: Secondary | ICD-10-CM

## 2019-11-22 DIAGNOSIS — Z7189 Other specified counseling: Secondary | ICD-10-CM

## 2019-11-22 DIAGNOSIS — R0600 Dyspnea, unspecified: Secondary | ICD-10-CM

## 2019-11-22 DIAGNOSIS — Z515 Encounter for palliative care: Secondary | ICD-10-CM

## 2019-11-22 DIAGNOSIS — F0390 Unspecified dementia without behavioral disturbance: Secondary | ICD-10-CM | POA: Diagnosis present

## 2019-11-22 DIAGNOSIS — Z91018 Allergy to other foods: Secondary | ICD-10-CM

## 2019-11-22 DIAGNOSIS — A419 Sepsis, unspecified organism: Secondary | ICD-10-CM | POA: Diagnosis not present

## 2019-11-22 DIAGNOSIS — I509 Heart failure, unspecified: Secondary | ICD-10-CM

## 2019-11-22 DIAGNOSIS — Z882 Allergy status to sulfonamides status: Secondary | ICD-10-CM

## 2019-11-22 DIAGNOSIS — Z7901 Long term (current) use of anticoagulants: Secondary | ICD-10-CM

## 2019-11-22 DIAGNOSIS — J441 Chronic obstructive pulmonary disease with (acute) exacerbation: Secondary | ICD-10-CM

## 2019-11-22 DIAGNOSIS — J9601 Acute respiratory failure with hypoxia: Secondary | ICD-10-CM | POA: Diagnosis present

## 2019-11-22 DIAGNOSIS — I4891 Unspecified atrial fibrillation: Secondary | ICD-10-CM | POA: Diagnosis present

## 2019-11-22 DIAGNOSIS — I110 Hypertensive heart disease with heart failure: Secondary | ICD-10-CM | POA: Diagnosis present

## 2019-11-22 DIAGNOSIS — N390 Urinary tract infection, site not specified: Secondary | ICD-10-CM | POA: Diagnosis not present

## 2019-11-22 DIAGNOSIS — I480 Paroxysmal atrial fibrillation: Secondary | ICD-10-CM

## 2019-11-22 HISTORY — DX: Hyperlipidemia, unspecified: E78.5

## 2019-11-22 HISTORY — DX: Paroxysmal atrial fibrillation: I48.0

## 2019-11-22 LAB — BASIC METABOLIC PANEL
Anion gap: 10 (ref 5–15)
BUN: 14 mg/dL (ref 8–23)
CO2: 24 mmol/L (ref 22–32)
Calcium: 9.2 mg/dL (ref 8.9–10.3)
Chloride: 103 mmol/L (ref 98–111)
Creatinine, Ser: 0.69 mg/dL (ref 0.44–1.00)
GFR calc Af Amer: >60 mL/min (ref >60)
GFR calc non Af Amer: >60 mL/min (ref >60)
Glucose, Bld: 180 mg/dL — ABNORMAL HIGH (ref 70–99)
Potassium: 3.8 mmol/L (ref 3.5–5.1)
Sodium: 137 mmol/L (ref 135–145)

## 2019-11-22 LAB — CBC
HCT: 35.2 % — ABNORMAL LOW (ref 36.0–46.0)
Hemoglobin: 11.1 g/dL — ABNORMAL LOW (ref 12.0–15.0)
MCH: 28 pg (ref 26.0–34.0)
MCHC: 31.5 g/dL (ref 30.0–36.0)
MCV: 88.9 fL (ref 80.0–100.0)
Platelets: 369 10*3/uL (ref 150–400)
RBC: 3.96 MIL/uL (ref 3.87–5.11)
RDW: 14 % (ref 11.5–15.5)
WBC: 11.3 10*3/uL — ABNORMAL HIGH (ref 4.0–10.5)
nRBC: 0 % (ref 0.0–0.2)

## 2019-11-22 LAB — TROPONIN I (HIGH SENSITIVITY): Troponin I (High Sensitivity): 41 ng/L — ABNORMAL HIGH (ref <18)

## 2019-11-22 NOTE — ED Triage Notes (Signed)
Pt arrives by EMS from [facility] [place] for CP and sob since today.  Pt is fully vaccinated. Pt has 324mg  asa, 1 sl nitro and has a 18g SL left forearm.

## 2019-11-23 ENCOUNTER — Encounter (HOSPITAL_COMMUNITY): Payer: Self-pay | Admitting: Internal Medicine

## 2019-11-23 ENCOUNTER — Inpatient Hospital Stay (HOSPITAL_COMMUNITY): Payer: Medicare Other

## 2019-11-23 DIAGNOSIS — I4811 Longstanding persistent atrial fibrillation: Secondary | ICD-10-CM | POA: Diagnosis not present

## 2019-11-23 DIAGNOSIS — I252 Old myocardial infarction: Secondary | ICD-10-CM | POA: Diagnosis not present

## 2019-11-23 DIAGNOSIS — R6510 Systemic inflammatory response syndrome (SIRS) of non-infectious origin without acute organ dysfunction: Secondary | ICD-10-CM

## 2019-11-23 DIAGNOSIS — I340 Nonrheumatic mitral (valve) insufficiency: Secondary | ICD-10-CM

## 2019-11-23 DIAGNOSIS — I110 Hypertensive heart disease with heart failure: Secondary | ICD-10-CM | POA: Diagnosis not present

## 2019-11-23 DIAGNOSIS — Z7984 Long term (current) use of oral hypoglycemic drugs: Secondary | ICD-10-CM | POA: Diagnosis not present

## 2019-11-23 DIAGNOSIS — Z66 Do not resuscitate: Secondary | ICD-10-CM | POA: Diagnosis not present

## 2019-11-23 DIAGNOSIS — E785 Hyperlipidemia, unspecified: Secondary | ICD-10-CM | POA: Diagnosis present

## 2019-11-23 DIAGNOSIS — Z20822 Contact with and (suspected) exposure to covid-19: Secondary | ICD-10-CM | POA: Diagnosis not present

## 2019-11-23 DIAGNOSIS — Z515 Encounter for palliative care: Secondary | ICD-10-CM | POA: Diagnosis not present

## 2019-11-23 DIAGNOSIS — I4891 Unspecified atrial fibrillation: Secondary | ICD-10-CM | POA: Diagnosis present

## 2019-11-23 DIAGNOSIS — F0390 Unspecified dementia without behavioral disturbance: Secondary | ICD-10-CM | POA: Diagnosis present

## 2019-11-23 DIAGNOSIS — I480 Paroxysmal atrial fibrillation: Secondary | ICD-10-CM | POA: Diagnosis not present

## 2019-11-23 DIAGNOSIS — Z7982 Long term (current) use of aspirin: Secondary | ICD-10-CM | POA: Diagnosis not present

## 2019-11-23 DIAGNOSIS — R0602 Shortness of breath: Secondary | ICD-10-CM | POA: Diagnosis present

## 2019-11-23 DIAGNOSIS — J209 Acute bronchitis, unspecified: Secondary | ICD-10-CM | POA: Diagnosis not present

## 2019-11-23 DIAGNOSIS — R079 Chest pain, unspecified: Secondary | ICD-10-CM

## 2019-11-23 DIAGNOSIS — N390 Urinary tract infection, site not specified: Secondary | ICD-10-CM | POA: Diagnosis not present

## 2019-11-23 DIAGNOSIS — Z79899 Other long term (current) drug therapy: Secondary | ICD-10-CM | POA: Diagnosis not present

## 2019-11-23 DIAGNOSIS — E1165 Type 2 diabetes mellitus with hyperglycemia: Secondary | ICD-10-CM | POA: Diagnosis not present

## 2019-11-23 DIAGNOSIS — A419 Sepsis, unspecified organism: Secondary | ICD-10-CM | POA: Diagnosis not present

## 2019-11-23 DIAGNOSIS — I351 Nonrheumatic aortic (valve) insufficiency: Secondary | ICD-10-CM | POA: Diagnosis not present

## 2019-11-23 DIAGNOSIS — Z7901 Long term (current) use of anticoagulants: Secondary | ICD-10-CM

## 2019-11-23 DIAGNOSIS — J9601 Acute respiratory failure with hypoxia: Secondary | ICD-10-CM | POA: Diagnosis not present

## 2019-11-23 DIAGNOSIS — I5021 Acute systolic (congestive) heart failure: Secondary | ICD-10-CM | POA: Diagnosis not present

## 2019-11-23 DIAGNOSIS — I5043 Acute on chronic combined systolic (congestive) and diastolic (congestive) heart failure: Secondary | ICD-10-CM | POA: Diagnosis not present

## 2019-11-23 DIAGNOSIS — J441 Chronic obstructive pulmonary disease with (acute) exacerbation: Secondary | ICD-10-CM | POA: Diagnosis not present

## 2019-11-23 DIAGNOSIS — I429 Cardiomyopathy, unspecified: Secondary | ICD-10-CM | POA: Diagnosis not present

## 2019-11-23 DIAGNOSIS — I509 Heart failure, unspecified: Secondary | ICD-10-CM

## 2019-11-23 DIAGNOSIS — R627 Adult failure to thrive: Secondary | ICD-10-CM | POA: Diagnosis not present

## 2019-11-23 DIAGNOSIS — Z955 Presence of coronary angioplasty implant and graft: Secondary | ICD-10-CM | POA: Diagnosis not present

## 2019-11-23 DIAGNOSIS — Z7189 Other specified counseling: Secondary | ICD-10-CM | POA: Diagnosis not present

## 2019-11-23 DIAGNOSIS — I2510 Atherosclerotic heart disease of native coronary artery without angina pectoris: Secondary | ICD-10-CM | POA: Diagnosis present

## 2019-11-23 DIAGNOSIS — Z87891 Personal history of nicotine dependence: Secondary | ICD-10-CM | POA: Diagnosis not present

## 2019-11-23 DIAGNOSIS — J440 Chronic obstructive pulmonary disease with acute lower respiratory infection: Secondary | ICD-10-CM | POA: Diagnosis not present

## 2019-11-23 LAB — URINALYSIS, ROUTINE W REFLEX MICROSCOPIC
Bilirubin Urine: NEGATIVE
Glucose, UA: NEGATIVE mg/dL
Hgb urine dipstick: NEGATIVE
Ketones, ur: NEGATIVE mg/dL
Leukocytes,Ua: NEGATIVE
Nitrite: NEGATIVE
Protein, ur: NEGATIVE mg/dL
Specific Gravity, Urine: 1.014 (ref 1.005–1.030)
pH: 5 (ref 5.0–8.0)

## 2019-11-23 LAB — HEMOGLOBIN A1C
Hgb A1c MFr Bld: 6.9 % — ABNORMAL HIGH (ref 4.8–5.6)
Mean Plasma Glucose: 151.33 mg/dL

## 2019-11-23 LAB — TYPE AND SCREEN
ABO/RH(D): B NEG
Antibody Screen: NEGATIVE

## 2019-11-23 LAB — ECHOCARDIOGRAM COMPLETE
Calc EF: 41.4 %
MV M vel: 5.23 m/s
MV Peak grad: 109.4 mmHg
P 1/2 time: 456 msec
Radius: 0.3 cm
S' Lateral: 3.6 cm
Single Plane A2C EF: 37.8 %
Single Plane A4C EF: 41.6 %

## 2019-11-23 LAB — HEPATITIS B SURFACE ANTIGEN: Hepatitis B Surface Ag: NONREACTIVE

## 2019-11-23 LAB — TSH: TSH: 0.991 u[IU]/mL (ref 0.350–4.500)

## 2019-11-23 LAB — TROPONIN I (HIGH SENSITIVITY): Troponin I (High Sensitivity): 45 ng/L — ABNORMAL HIGH (ref <18)

## 2019-11-23 LAB — LACTIC ACID, PLASMA: Lactic Acid, Venous: 1.3 mmol/L (ref 0.5–1.9)

## 2019-11-23 LAB — C-REACTIVE PROTEIN: CRP: 2.4 mg/dL — ABNORMAL HIGH (ref <1.0)

## 2019-11-23 LAB — CBG MONITORING, ED: Glucose-Capillary: 143 mg/dL — ABNORMAL HIGH (ref 70–99)

## 2019-11-23 LAB — FIBRINOGEN: Fibrinogen: 510 mg/dL — ABNORMAL HIGH (ref 210–475)

## 2019-11-23 LAB — FERRITIN: Ferritin: 65 ng/mL (ref 11–307)

## 2019-11-23 LAB — D-DIMER, QUANTITATIVE: D-Dimer, Quant: 0.57 ug/mL-FEU — ABNORMAL HIGH (ref 0.00–0.50)

## 2019-11-23 LAB — GLUCOSE, CAPILLARY
Glucose-Capillary: 192 mg/dL — ABNORMAL HIGH (ref 70–99)
Glucose-Capillary: 195 mg/dL — ABNORMAL HIGH (ref 70–99)

## 2019-11-23 LAB — BRAIN NATRIURETIC PEPTIDE: B Natriuretic Peptide: 687.3 pg/mL — ABNORMAL HIGH (ref 0.0–100.0)

## 2019-11-23 LAB — PROCALCITONIN: Procalcitonin: <0.1 ng/mL

## 2019-11-23 LAB — LACTATE DEHYDROGENASE: LDH: 182 U/L (ref 98–192)

## 2019-11-23 LAB — SARS CORONAVIRUS 2 BY RT PCR (HOSPITAL ORDER, PERFORMED IN ~~LOC~~ HOSPITAL LAB): SARS Coronavirus 2: NEGATIVE

## 2019-11-23 LAB — ABO/RH: ABO/RH(D): B NEG

## 2019-11-23 MED ORDER — POTASSIUM CHLORIDE CRYS ER 10 MEQ PO TBCR
10.0000 meq | EXTENDED_RELEASE_TABLET | ORAL | Status: DC
  Administered 2019-11-23: 10 meq via ORAL
  Filled 2019-11-23 (×2): qty 1

## 2019-11-23 MED ORDER — HYDRALAZINE HCL 20 MG/ML IJ SOLN: 10.0000 mg | INTRAMUSCULAR | Status: DC | PRN

## 2019-11-23 MED ORDER — SUMATRIPTAN SUCCINATE 50 MG PO TABS: 50.0000 mg | ORAL_TABLET | ORAL | Status: DC | PRN

## 2019-11-23 MED ORDER — INSULIN ASPART 100 UNIT/ML ~~LOC~~ SOLN
0.0000 [IU] | Freq: Three times a day (TID) | SUBCUTANEOUS | Status: DC
  Administered 2019-11-23 – 2019-11-24 (×2): 1 [IU] via SUBCUTANEOUS
  Administered 2019-11-24: 3 [IU] via SUBCUTANEOUS
  Administered 2019-11-24: 1 [IU] via SUBCUTANEOUS
  Administered 2019-11-25: 4 [IU] via SUBCUTANEOUS
  Administered 2019-11-25 (×2): 3 [IU] via SUBCUTANEOUS
  Administered 2019-11-26: 2 [IU] via SUBCUTANEOUS
  Administered 2019-11-26: 4 [IU] via SUBCUTANEOUS
  Administered 2019-11-26 – 2019-11-27 (×2): 2 [IU] via SUBCUTANEOUS

## 2019-11-23 MED ORDER — SODIUM CHLORIDE 0.9% FLUSH
3.0000 mL | Freq: Two times a day (BID) | INTRAVENOUS | Status: DC
  Administered 2019-11-23 – 2019-11-27 (×8): 3 mL via INTRAVENOUS

## 2019-11-23 MED ORDER — ADULT MULTIVITAMIN W/MINERALS CH
1.0000 | ORAL_TABLET | Freq: Every morning | ORAL | Status: DC
  Administered 2019-11-24 – 2019-11-27 (×4): 1 via ORAL
  Filled 2019-11-23 (×4): qty 1

## 2019-11-23 MED ORDER — DICLOFENAC SODIUM 1 % EX GEL
2.0000 g | Freq: Every day | CUTANEOUS | Status: DC | PRN
  Administered 2019-11-23: 21:00:00 2 g via TOPICAL
  Filled 2019-11-23: qty 100

## 2019-11-23 MED ORDER — RIVAROXABAN 20 MG PO TABS
20.0000 mg | ORAL_TABLET | Freq: Every day | ORAL | Status: DC
  Administered 2019-11-23 – 2019-11-25 (×3): 20 mg via ORAL
  Filled 2019-11-23 (×3): qty 1

## 2019-11-23 MED ORDER — MECLIZINE HCL 25 MG PO TABS
12.5000 mg | ORAL_TABLET | Freq: Every day | ORAL | Status: DC
  Administered 2019-11-24 – 2019-11-27 (×4): 12.5 mg via ORAL
  Filled 2019-11-23 (×4): qty 1

## 2019-11-23 MED ORDER — DOCUSATE SODIUM 100 MG PO CAPS
100.0000 mg | ORAL_CAPSULE | ORAL | Status: DC
  Administered 2019-11-23 – 2019-11-27 (×3): 100 mg via ORAL
  Filled 2019-11-23 (×3): qty 1

## 2019-11-23 MED ORDER — MECLIZINE HCL 25 MG PO TABS: 12.5000 mg | ORAL_TABLET | Freq: Every evening | ORAL | Status: DC | PRN

## 2019-11-23 MED ORDER — ACETAMINOPHEN 325 MG PO TABS
650.0000 mg | ORAL_TABLET | Freq: Four times a day (QID) | ORAL | Status: DC | PRN
  Administered 2019-11-23 – 2019-11-26 (×2): 650 mg via ORAL
  Filled 2019-11-23 (×2): qty 2

## 2019-11-23 MED ORDER — PANTOPRAZOLE SODIUM 40 MG PO TBEC
40.0000 mg | DELAYED_RELEASE_TABLET | Freq: Every morning | ORAL | Status: DC
  Administered 2019-11-24 – 2019-11-27 (×4): 40 mg via ORAL
  Filled 2019-11-23 (×4): qty 1

## 2019-11-23 MED ORDER — GABAPENTIN 100 MG PO CAPS
100.0000 mg | ORAL_CAPSULE | Freq: Three times a day (TID) | ORAL | Status: DC
  Administered 2019-11-23 – 2019-11-27 (×11): 100 mg via ORAL
  Filled 2019-11-23 (×11): qty 1

## 2019-11-23 MED ORDER — DULOXETINE HCL 60 MG PO CPEP
60.0000 mg | ORAL_CAPSULE | Freq: Every day | ORAL | Status: DC
  Administered 2019-11-23 – 2019-11-27 (×5): 60 mg via ORAL
  Filled 2019-11-23 (×5): qty 1

## 2019-11-23 MED ORDER — ACETAMINOPHEN 650 MG RE SUPP: 650.0000 mg | Freq: Four times a day (QID) | RECTAL | Status: DC | PRN

## 2019-11-23 MED ORDER — FUROSEMIDE 10 MG/ML IJ SOLN: 40.0000 mg | Freq: Every day | INTRAMUSCULAR | Status: DC

## 2019-11-23 MED ORDER — ASPIRIN 81 MG PO CHEW
81.0000 mg | CHEWABLE_TABLET | Freq: Every day | ORAL | Status: DC
  Administered 2019-11-23 – 2019-11-27 (×5): 81 mg via ORAL
  Filled 2019-11-23 (×5): qty 1

## 2019-11-23 MED ORDER — FUROSEMIDE 10 MG/ML IJ SOLN
40.0000 mg | Freq: Two times a day (BID) | INTRAMUSCULAR | Status: DC
  Administered 2019-11-23: 40 mg via INTRAVENOUS
  Filled 2019-11-23: qty 4

## 2019-11-23 MED ORDER — POTASSIUM CHLORIDE CRYS ER 20 MEQ PO TBCR
40.0000 meq | EXTENDED_RELEASE_TABLET | ORAL | Status: AC
  Administered 2019-11-23: 40 meq via ORAL
  Filled 2019-11-23: qty 2

## 2019-11-23 MED ORDER — ONDANSETRON HCL 4 MG/2ML IJ SOLN: 4.0000 mg | Freq: Four times a day (QID) | INTRAMUSCULAR | Status: DC | PRN

## 2019-11-23 MED ORDER — ALBUTEROL SULFATE (2.5 MG/3ML) 0.083% IN NEBU: 2.5000 mg | INHALATION_SOLUTION | Freq: Four times a day (QID) | RESPIRATORY_TRACT | Status: DC | PRN

## 2019-11-23 MED ORDER — TAMSULOSIN HCL 0.4 MG PO CAPS
0.4000 mg | ORAL_CAPSULE | Freq: Every morning | ORAL | Status: DC
  Administered 2019-11-24 – 2019-11-27 (×4): 0.4 mg via ORAL
  Filled 2019-11-23 (×4): qty 1

## 2019-11-23 MED ORDER — METOPROLOL SUCCINATE ER 100 MG PO TB24
100.0000 mg | ORAL_TABLET | Freq: Every day | ORAL | Status: DC
  Administered 2019-11-23 – 2019-11-24 (×2): 100 mg via ORAL
  Filled 2019-11-23 (×2): qty 1

## 2019-11-23 MED ORDER — ONDANSETRON HCL 4 MG PO TABS: 4.0000 mg | ORAL_TABLET | Freq: Four times a day (QID) | ORAL | Status: DC | PRN

## 2019-11-23 MED ORDER — ATORVASTATIN CALCIUM 10 MG PO TABS
10.0000 mg | ORAL_TABLET | Freq: Every day | ORAL | Status: DC
  Administered 2019-11-23 – 2019-11-27 (×5): 10 mg via ORAL
  Filled 2019-11-23 (×5): qty 1

## 2019-11-23 NOTE — ED Provider Notes (Signed)
[facility] EMERGENCY DEPARTMENT Provider Note   CSN: [id number] Arrival date & time: 11/22/19  1810     History Chief Complaint  Patient presents with  . Chest Pain    Lindsey [name] is a 86 y.o. female.  Patient presents to the ED with a chief complaint of SOB x 3 days.  She is coming from [place] via EMS.  She also complains of intermittent episodes of chest pain.  She has documented hx of hypertension and vertigo, but is noted to be on xarelto and has EKG showing a-fib.  She reports she has had some fever, but also reports that she is being treated for UTI.  She is DNR. Patient was given 324 of aspirin and one nitroglycerin.  The history is provided by the patient. No language interpreter was used.       Past Medical History:  Diagnosis Date  . Hypertension   . Vertigo     There are no problems to display for this patient.   History reviewed. No pertinent surgical history.   OB History   No obstetric history on file.     No family history on file.  Social History   Tobacco Use  . Smoking status: Not on file  Substance Use Topics  . Alcohol use: Not on file  . Drug use: Not on file    Home Medications Prior to Admission medications   Medication Sig Start Date End Date Taking? Authorizing Provider  aspirin 81 MG chewable tablet Chew 81 mg by mouth daily.    [provider]  atorvastatin (LIPITOR) 10 MG tablet Take 10 mg by mouth daily.    [provider]  cephALEXin (KEFLEX) 500 MG capsule Take 2 capsules (1,000 mg total) by mouth 2 (two) times daily. 11/15/19   Cathren Laine, MD  diclofenac Sodium (VOLTAREN) 1 % GEL Apply 2 g topically daily as needed (pain).    [provider]  docusate sodium (COLACE) 100 MG capsule Take 100 mg by mouth every other day.    [provider]  DULoxetine (CYMBALTA) 60 MG capsule Take 60 mg by mouth daily.    [provider]  furosemide (LASIX) 40 MG tablet  Take 40 mg by mouth daily.    [provider]  gabapentin (NEURONTIN) 100 MG capsule Take 100 mg by mouth 3 (three) times daily.    [provider]  meclizine (ANTIVERT) 25 MG tablet Take 25 mg by mouth every 8 (eight) hours as needed (motion sickness).    [provider]  metFORMIN (GLUCOPHAGE) 500 MG tablet Take 500 mg by mouth in the morning and at bedtime.    [provider]  metoprolol succinate (TOPROL-XL) 100 MG 24 hr tablet Take 100 mg by mouth daily. Take with or immediately following a meal.    [provider]  Multiple Vitamin (MULTIVITAMIN WITH MINERALS) TABS tablet Take 1 tablet by mouth daily.    [provider]  pantoprazole (PROTONIX) 40 MG tablet Take 40 mg by mouth daily.    [provider]  potassium chloride (KLOR-CON) 10 MEQ tablet Take 10 mEq by mouth every Monday, Wednesday, and Friday.    [provider]  rivaroxaban (XARELTO) 20 MG TABS tablet Take 20 mg by mouth daily.    [provider]  SUMAtriptan (IMITREX) 50 MG tablet Take 50 mg by mouth every 2 (two) hours as needed for migraine. May repeat in 2 hours if headache persists or recurs.  [provider]  tamsulosin (FLOMAX) 0.4 MG CAPS capsule Take 0.4 mg by mouth daily.    [provider]  tiZANidine (ZANAFLEX) 2 MG tablet Take 2 mg by mouth daily.    [provider]    Allergies    Diazepam, Drug ingredient [monosodium glutamate], Iodinated diagnostic agents, Nitrofurantoin, Pineapple, Savella [milnacipran], Shellfish allergy, Sulfa antibiotics, and Tramadol  Review of Systems   Review of Systems  All other systems reviewed and are negative.   Physical Exam Updated Vital Signs BP (!) 144/82 (BP Location: Right Arm)   Pulse 92   Temp 98.5 F (36.9 C) (Oral)   Resp 20   SpO2 99%   Physical Exam Vitals and nursing note reviewed.  Constitutional:      General: She is not in acute distress.     Appearance: She is well-developed.  HENT:     Head: Normocephalic and atraumatic.  Eyes:     Conjunctiva/sclera: Conjunctivae normal.  Cardiovascular:     Rate and Rhythm: Regular rhythm. Tachycardia present.     Heart sounds: No murmur heard.   Pulmonary:     Effort: Pulmonary effort is normal. No respiratory distress.     Breath sounds: Normal breath sounds. No stridor. No wheezing, rhonchi or rales.  Chest:     Chest wall: No tenderness.  Abdominal:     Palpations: Abdomen is soft.     Tenderness: There is no abdominal tenderness.  Musculoskeletal:        General: Normal range of motion.     Cervical back: Neck supple.  Skin:    General: Skin is warm and dry.  Neurological:     Mental Status: She is alert and oriented to person, place, and time.  Psychiatric:        Mood and Affect: Mood normal.        Behavior: Behavior normal.     ED Results / Procedures / Treatments   Labs (all labs ordered are listed, but only abnormal results are displayed) Labs Reviewed  BASIC METABOLIC PANEL - Abnormal; Notable for the following components:      Result Value   Glucose, Bld 180 (*)    All other components within normal limits  CBC - Abnormal; Notable for the following components:   WBC 11.3 (*)    Hemoglobin 11.1 (*)    HCT 35.2 (*)    All other components within normal limits  TROPONIN I (HIGH SENSITIVITY) - Abnormal; Notable for the following components:   Troponin I (High Sensitivity) 41 (*)    All other components within normal limits  TROPONIN I (HIGH SENSITIVITY) - Abnormal; Notable for the following components:   Troponin I (High Sensitivity) 45 (*)    All other components within normal limits  SARS CORONAVIRUS 2 BY RT PCR (HOSPITAL ORDER, PERFORMED IN [facility])  URINALYSIS, ROUTINE W REFLEX MICROSCOPIC  PROCALCITONIN  LACTATE DEHYDROGENASE  HEPATITIS B SURFACE ANTIGEN  FERRITIN  FIBRINOGEN  D-DIMER, QUANTITATIVE (NOT AT [facility])  C-REACTIVE  PROTEIN  BRAIN NATRIURETIC PEPTIDE  ABO/RH  TYPE AND SCREEN    EKG EKG Interpretation  Date/Time:  Sunday November 22 2019 18:41:44 EDT Ventricular Rate:  106 PR Interval:    QRS Duration: 102 QT Interval:  350 QTC Calculation: 464 R Axis:   -20 Text Interpretation: Atrial fibrillation with rapid ventricular response Minimal voltage criteria for LVH, may be normal variant ( Cornell product ) Septal infarct , age undetermined Confirmed by Nicanor Alcon, April (  [id number]) on 11/23/2019 3:31:06 AM   Radiology DG Chest 2 View  Result Date: 11/22/2019 CLINICAL DATA:  Severe lower mid chest pain for 1 day, difficulty breathing EXAM: CHEST - 2 VIEW COMPARISON:  11/15/2019 FINDINGS: Frontal and lateral views of the chest demonstrate a stable cardiac silhouette allowing for differences in positioning and technique. No acute airspace disease, effusion, or pneumothorax. Stable atherosclerosis of the aorta. No acute bony abnormalities. IMPRESSION: 1. No acute intrathoracic process. Electronically Signed   By: Sharlet Salina M.D.   On: 11/22/2019 19:26    Procedures Procedures (including critical care time)  Medications Ordered in ED Medications - No data to display  ED Course  I have reviewed the triage vital signs and the nursing notes.  Pertinent labs & imaging results that were available during my care of the patient were reviewed by me and considered in my medical decision making (see chart for details).    MDM Rules/Calculators/A&P                          This patient complains of chest pain and SOB, this involves an extensive number of treatment options, and is a complaint that carries with it a high risk of complications and morbidity.    Differential Dx ACS, PE, covid, pneumonia  Pertinent Labs I ordered, reviewed, and interpreted labs, which included troponin is 41 initially and increases to 45 as repeat. D-dimer is slightly elevated at 0.57, but age-adjusted, this is reassuring.  Additionally patient is on Xarelto, I doubt PE. Patient has mildly elevated white blood cell count at 11.3. She is currently being treated with Keflex for UTI.  Imaging Interpretation I ordered imaging studies which included chest x-ray.  I independently visualized and interpreted the chest x-ray, which showed no apparent infiltrate.   Sources Previous records obtained and reviewed previous documented on EKG with A. Fib.  Reassessments After the interventions stated above, I reevaluated the patient and found patient no longer complaining of chest pain, but still complaining of shortness of breath.  Given patient's persistent shortness of breath, elevated troponins, and low-grade fever in triage, feel that patient should be admitted for additional observation and monitoring. I discussed this plan with Dr. Nicanor Alcon, who agrees with the plan.  Plan Admit    Final Clinical Impression(s) / ED Diagnoses Final diagnoses:  Shortness of breath    Rx / DC Orders ED Discharge Orders    None       Felipa Furnace 11/23/19 2204    Palumbo, April, MD 11/23/19 2326

## 2019-11-23 NOTE — ED Notes (Signed)
Walked patient to the bathroom patient did well patient is now back in bed on the monitor with call bell in reach  

## 2019-11-23 NOTE — ED Notes (Signed)
COVID test is NEG

## 2019-11-23 NOTE — Progress Notes (Signed)
  Echocardiogram 2D Echocardiogram has been performed.  Augustine Radar 11/23/2019, 4:28 PM

## 2019-11-23 NOTE — ED Provider Notes (Signed)
Care assumed from [employer], [place]. See his note for full H&P.  Per his note, "Lindsey [name] is a 86 y.o. female.  Patient presents to the ED with a chief complaint of SOB x 3 days.  She is coming from [place] via EMS.  She also complains of intermittent episodes of chest pain.  She has documented hx of hypertension and vertigo, but is noted to be on xarelto and has EKG showing a-fib.  She reports she has had some fever, but also reports that she is being treated for UTI.  She is DNR. Patient was given 324 of aspirin and one nitroglycerin.  The history is provided by the patient. No language interpreter was used."   Physical Exam  BP (!) 155/73   Pulse 83   Temp 98.5 F (36.9 C) (Oral)   Resp (!) 21   SpO2 95%   Physical Exam  ED Course/Procedures     Procedures Results for orders placed or performed during the hospital encounter of 11/22/19  SARS Coronavirus 2 by RT PCR (hospital order, performed in [facility] hospital lab) Nasopharyngeal Nasopharyngeal Swab   Specimen: Nasopharyngeal Swab  Result Value Ref Range   SARS Coronavirus 2 NEGATIVE NEGATIVE  Basic metabolic panel  Result Value Ref Range   Sodium 137 135 - 145 mmol/L   Potassium 3.8 3.5 - 5.1 mmol/L   Chloride 103 98 - 111 mmol/L   CO2 24 22 - 32 mmol/L   Glucose, Bld 180 (H) 70 - 99 mg/dL   BUN 14 8 - 23 mg/dL   Creatinine, Ser 1.06 0.44 - 1.00 mg/dL   Calcium 9.2 8.9 - 26.9 mg/dL   GFR calc non Af Amer >60 >60 mL/min   GFR calc Af Amer >60 >60 mL/min   Anion gap 10 5 - 15  CBC  Result Value Ref Range   WBC 11.3 (H) 4.0 - 10.5 K/uL   RBC 3.96 3.87 - 5.11 MIL/uL   Hemoglobin 11.1 (L) 12.0 - 15.0 g/dL   HCT 48.5 (L) 36 - 46 %   MCV 88.9 80.0 - 100.0 fL   MCH 28.0 26.0 - 34.0 pg   MCHC 31.5 30.0 - 36.0 g/dL   RDW 46.2 70.3 - 50.0 %   Platelets 369 150 - 400 K/uL   nRBC 0.0 0.0 - 0.2 %  Urinalysis, Routine w reflex microscopic Urine, Clean Catch  Result Value Ref Range   Color, Urine YELLOW  YELLOW   APPearance HAZY (A) CLEAR   Specific Gravity, Urine 1.014 1.005 - 1.030   pH 5.0 5.0 - 8.0   Glucose, UA NEGATIVE NEGATIVE mg/dL   Hgb urine dipstick NEGATIVE NEGATIVE   Bilirubin Urine NEGATIVE NEGATIVE   Ketones, ur NEGATIVE NEGATIVE mg/dL   Protein, ur NEGATIVE NEGATIVE mg/dL   Nitrite NEGATIVE NEGATIVE   Leukocytes,Ua NEGATIVE NEGATIVE  Procalcitonin  Result Value Ref Range   Procalcitonin <0.10 ng/mL  Lactate dehydrogenase  Result Value Ref Range   LDH 182 98 - 192 U/L  Ferritin  Result Value Ref Range   Ferritin 65 11 - 307 ng/mL  Fibrinogen  Result Value Ref Range   Fibrinogen 510 (H) 210 - 475 mg/dL  D-dimer, quantitative (not at [facility])  Result Value Ref Range   D-Dimer, Quant 0.57 (H) 0.00 - 0.50 ug/mL-FEU  C-reactive protein  Result Value Ref Range   CRP 2.4 (H) <1.0 mg/dL  Brain natriuretic peptide  Result Value Ref Range   B Natriuretic Peptide 687.3 (H)  0.0 - 100.0 pg/mL  ABO/Rh  Result Value Ref Range   ABO/RH(D)      B NEG Performed at [facility], [address] [place], [place] [address]   Type and screen [facility]  Result Value Ref Range   ABO/RH(D) PENDING    Antibody Screen PENDING    Sample Expiration      11/26/2019,2359 Performed at [facility], [address] [place], [place] [address]   Troponin I (High Sensitivity)  Result Value Ref Range   Troponin I (High Sensitivity) 41 (H) <18 ng/L  Troponin I (High Sensitivity)  Result Value Ref Range   Troponin I (High Sensitivity) 45 (H) <18 ng/L   DG Chest 2 View  Result Date: 11/22/2019 CLINICAL DATA:  Severe lower mid chest pain for 1 day, difficulty breathing EXAM: CHEST - 2 VIEW COMPARISON:  11/15/2019 FINDINGS: Frontal and lateral views of the chest demonstrate a stable cardiac silhouette allowing for differences in positioning and technique. No acute airspace disease, effusion, or pneumothorax. Stable atherosclerosis of the aorta. No acute bony  abnormalities. IMPRESSION: 1. No acute intrathoracic process. Electronically Signed   By: Sharlet Salina M.D.   On: 11/22/2019 19:26   DG Chest Port 1 View  Result Date: 11/15/2019 CLINICAL DATA:  Fever, lethargy EXAM: PORTABLE CHEST 1 VIEW COMPARISON:  None. FINDINGS: Single frontal view of the chest demonstrates an unremarkable cardiac silhouette. No airspace disease, effusion, or pneumothorax. No acute bony abnormalities. IMPRESSION: 1. No acute intrathoracic process. Electronically Signed   By: Sharlet Salina M.D.   On: 11/15/2019 15:05     MDM   Briefly, 86 y/o F presenting for eval of chest pain/sob x3 days. She has also had a fever and is currently being tx for UTI.   CBC with leukocytosis, mild anemia BMP nonacute Trop elevated Inflammatory markers elevated  EKG was nonischemic and CXR was reassuring.   Trops increasing from 41-45. CP has improved but pt still SOB. Less likely PE given anticoagulation. Given elevated trop, plan for admission.   7:12 AM CONSULT with Dr. Joneen Roach with hospitalist who accepts patient for admission.   Karrie Meres, PA-C 11/23/19 0744    Virgina Norfolk, DO 11/23/19 [phone]

## 2019-11-23 NOTE — H&P (Signed)
History and Physical    Lindsey [name] UJW:[id number] DOB: 10-Sep-1930 DOA: 11/22/2019  Referring MD/NP/PA: Lockie Molaebbie Crossley, MD PCP: Patient, No Pcp Per  Patient coming from: [place] assisted living via EMS  Chief Complaint: Shortness of breath  I have personally briefly reviewed patient's old medical records in [facility]   HPI: Lindsey [name] is a 86 y.o. female with medical history significant of hypertension, paroxysmal atrial fibrillation on Eliquis, diabetes mellitus type 2, presents with complaints of shortness of breath over the last week.  She denies having any cough, but reports symptoms seem worse when trying to lay down at night.  Previous seen in the emergency department at [facility] on 8/22 with reports of not feeling well and at that time was found to have a urinary tract infection.  She had been taking cephalexin as prescribed.  However, patient notes that she is still not felt well.  Daughter reported that the patient had been having nausea and vomiting earlier in the week, but currently denies having any of the symptoms.  Notes associated symptoms of intermittent chest discomfort and anxiety.  Denies having any change in weight, significant leg swelling, diarrhea, in route with EMS patient had been given 324 mg of aspirin and 1 nitroglycerin.  Daughter notes that the patient had just recently moved home here in June of this year and has not established care with a cardiologist.  ED Course: Upon arrival into the emergency department patient was noted to have a temperature of 100.4 F, pulse elevated up to 111, respirations 20-28, blood pressures 136/97-171/102, and O2 saturation currently maintained on room air.  Labs significant for WBC 11.3, hemoglobin 11.1, glucose 180, high-sensitivity troponin 41-> 45, D-dimer 0.57, fibrinogen 510, procalcitonin<0.1, CRP 2.4, and BNP 687.3,.  Chest x-ray showed no acute abnormalities.  UA was negative for signs of infection.   COVID-19 screening was negative.  TRH called to admit.  Review of Systems  Constitutional: Positive for malaise/fatigue. Negative for fever.  HENT: Positive for hearing loss. Negative for ear discharge and nosebleeds.   Eyes: Negative for photophobia and pain.  Respiratory: Positive for shortness of breath. Negative for cough.   Cardiovascular: Positive for chest pain. Negative for leg swelling.  Gastrointestinal: Negative for abdominal pain, nausea and vomiting.  Genitourinary: Negative for dysuria and frequency.  Musculoskeletal: Negative for falls.  Skin: Negative for rash.  Neurological: Negative for focal weakness and loss of consciousness.  Psychiatric/Behavioral: Negative for substance abuse. The patient is nervous/anxious.     Past Medical History:  Diagnosis Date  . Hypertension   . Vertigo     History reviewed. No pertinent surgical history.   has no history on file for tobacco use, alcohol use, and drug use.  Allergies  Allergen Reactions  . Diazepam     Unknown reaction  . Drug Ingredient [Monosodium Glutamate]     MSG - Unknown reaction  . Iodinated Diagnostic Agents     Unknown reaction  . Nitrofurantoin     Unknown reaction  . Pineapple     Unknown reaction  . Savella [Milnacipran]     Unknown reaction  . Shellfish Allergy     Unknown reaction  . Sulfa Antibiotics     Unknown reaction  . Tramadol     Unknown reaction    No family history on file.  Prior to Admission medications   Medication Sig Start Date End Date Taking? Authorizing Provider  aspirin 81 MG chewable tablet Chew 81 mg by mouth  daily.    [provider]  atorvastatin (LIPITOR) 10 MG tablet Take 10 mg by mouth daily.    [provider]  cephALEXin (KEFLEX) 500 MG capsule Take 2 capsules (1,000 mg total) by mouth 2 (two) times daily. 11/15/19   Cathren Laine, MD  diclofenac Sodium (VOLTAREN) 1 % GEL Apply 2 g topically daily as needed (pain).    [provider]  docusate sodium (COLACE) 100 MG capsule Take 100 mg by mouth every other day.    [provider]  DULoxetine (CYMBALTA) 60 MG capsule Take 60 mg by mouth daily.    [provider]  furosemide (LASIX) 40 MG tablet Take 40 mg by mouth daily.    [provider]  gabapentin (NEURONTIN) 100 MG capsule Take 100 mg by mouth 3 (three) times daily.    [provider]  meclizine (ANTIVERT) 25 MG tablet Take 25 mg by mouth every 8 (eight) hours as needed (motion sickness).    [provider]  metFORMIN (GLUCOPHAGE) 500 MG tablet Take 500 mg by mouth in the morning and at bedtime.    [provider]  metoprolol succinate (TOPROL-XL) 100 MG 24 hr tablet Take 100 mg by mouth daily. Take with or immediately following a meal.    [provider]  Multiple Vitamin (MULTIVITAMIN WITH MINERALS) TABS tablet Take 1 tablet by mouth daily.    [provider]  pantoprazole (PROTONIX) 40 MG tablet Take 40 mg by mouth daily.    [provider]  potassium chloride (KLOR-CON) 10 MEQ tablet Take 10 mEq by mouth every Monday, Wednesday, and Friday.    [provider]  rivaroxaban (XARELTO) 20 MG TABS tablet Take 20 mg by mouth daily.    [provider]  SUMAtriptan (IMITREX) 50 MG tablet Take 50 mg by mouth every 2 (two) hours as needed for migraine. May repeat in 2 hours if headache persists or recurs.    [provider]  tamsulosin (FLOMAX) 0.4 MG CAPS capsule Take 0.4 mg by mouth daily.    [provider]  tiZANidine (ZANAFLEX) 2 MG tablet Take 2 mg by mouth daily.    [provider]    Physical Exam:  Constitutional: Elderly female who appears to be in some distress Vitals:   11/23/19 0530 11/23/19 0600 11/23/19 0630 11/23/19 0700  BP: (!) 147/84 137/83 (!) 147/80 (!) 155/73  Pulse: 87 (!) 37 95 83  Resp: (!) 25 (!) 28 (!) 27 (!) 21  Temp:      TempSrc:      SpO2: 97% 96% 96% 95%    Eyes: PERRL, lids and conjunctivae normal ENMT: Mucous membranes are moist. Posterior pharynx clear of any exudate or lesions.hard of hearing. Neck: normal, supple, no masses, no thyromegaly Respiratory: Intermittent crackles appreciated.  Patient currently on 2 L nasal cannula oxygen with O2 saturations maintained. Cardiovascular: Regular rate and rhythm, no murmurs / rubs / gallops.  Trace lower extremity edema. 2+ pedal pulses. No carotid bruits.  Abdomen: no tenderness, no masses palpated. No hepatosplenomegaly. Bowel sounds positive.  Musculoskeletal: no clubbing / cyanosis. No joint deformity upper and lower extremities. Good ROM, no contractures. Normal muscle tone.  Skin: no rashes, lesions, ulcers. No induration Neurologic: CN 2-12 grossly intact. Sensation intact, DTR normal. Strength 5/5 in all 4.  Psychiatric: Normal judgment and insight. Alert and oriented x 3.  Anxious mood.     Labs on Admission: I have personally reviewed following labs and  imaging studies  CBC: Recent Labs  Lab 11/22/19 1814  WBC 11.3*  HGB 11.1*  HCT 35.2*  MCV 88.9  PLT 369   Basic Metabolic Panel: Recent Labs  Lab 11/22/19 1814  NA 137  K 3.8  CL 103  CO2 24  GLUCOSE 180*  BUN 14  CREATININE 0.69  CALCIUM 9.2   GFR: CrCl cannot be calculated (Unknown ideal weight.). Liver Function Tests: No results for input(s): AST, ALT, ALKPHOS, BILITOT, PROT, ALBUMIN in the last 168 hours. No results for input(s): LIPASE, AMYLASE in the last 168 hours. No results for input(s): AMMONIA in the last 168 hours. Coagulation Profile: No results for input(s): INR, PROTIME in the last 168 hours. Cardiac Enzymes: No results for input(s): CKTOTAL, CKMB, CKMBINDEX, TROPONINI in the last 168 hours. BNP (last 3 results) No results for input(s): PROBNP in the last 8760 hours. HbA1C: No results for input(s): HGBA1C in the last 72 hours. CBG: No results for input(s): GLUCAP in the last 168 hours. Lipid  Profile: No results for input(s): CHOL, HDL, LDLCALC, TRIG, CHOLHDL, LDLDIRECT in the last 72 hours. Thyroid Function Tests: No results for input(s): TSH, T4TOTAL, FREET4, T3FREE, THYROIDAB in the last 72 hours. Anemia Panel: Recent Labs    11/23/19 0518  FERRITIN 65   Urine analysis:    Component Value Date/Time   COLORURINE YELLOW 11/23/2019 0630   APPEARANCEUR HAZY (A) 11/23/2019 0630   LABSPEC 1.014 11/23/2019 0630   PHURINE 5.0 11/23/2019 0630   GLUCOSEU NEGATIVE 11/23/2019 0630   HGBUR NEGATIVE 11/23/2019 0630   BILIRUBINUR NEGATIVE 11/23/2019 0630   KETONESUR NEGATIVE 11/23/2019 0630   PROTEINUR NEGATIVE 11/23/2019 0630   NITRITE NEGATIVE 11/23/2019 0630   LEUKOCYTESUR NEGATIVE 11/23/2019 0630   Sepsis Labs: Recent Results (from the past 240 hour(s))  SARS Coronavirus 2 by RT PCR (hospital order, performed in [facility] hospital lab) Nasopharyngeal Nasopharyngeal Swab     Status: None   Collection Time: 11/15/19  3:36 PM   Specimen: Nasopharyngeal Swab  Result Value Ref Range Status   SARS Coronavirus 2 NEGATIVE NEGATIVE Final    Comment: (NOTE) SARS-CoV-2 target nucleic acids are NOT DETECTED.  The SARS-CoV-2 RNA is generally detectable in upper and lower respiratory specimens during the acute phase of infection. The lowest concentration of SARS-CoV-2 viral copies this assay can detect is 250 copies / mL. A negative result does not preclude SARS-CoV-2 infection and should not be used as the sole basis for treatment or other patient management decisions.  A negative result may occur with improper specimen collection / handling, submission of specimen other than nasopharyngeal swab, presence of viral mutation(s) within the areas targeted by this assay, and inadequate number of viral copies (<250 copies / mL). A negative result must be combined with clinical observations, patient history, and epidemiological information.  Fact Sheet for Patients:     BoilerBrush.com.cy  Fact Sheet for Healthcare Providers: https://pope.com/  This test is not yet approved or  cleared by the [place] FDA and has been authorized for detection and/or diagnosis of SARS-CoV-2 by FDA under an Emergency Use Authorization (EUA).  This EUA will remain in effect (meaning this test can be used) for the duration of the COVID-19 declaration under Section 564(b)(1) of the Act, 21 U.S.C. section 360bbb-3(b)(1), unless the authorization is terminated or revoked sooner.  Performed at [facility], [address], [place], [place] [address]   Urine Culture     Status: Abnormal   Collection Time:  11/15/19  6:09 PM   Specimen: Urine, Random  Result Value Ref Range Status   Specimen Description   Final    URINE, RANDOM Performed at [facility], [address], [place], [place] [address]    Special Requests   Final    NONE Performed at [facility], [address], [place], [place] [address]    Culture MULTIPLE SPECIES PRESENT, SUGGEST RECOLLECTION (A)  Final   Report Status 11/16/2019 FINAL  Final  SARS Coronavirus 2 by RT PCR (hospital order, performed in [facility] hospital lab) Nasopharyngeal Nasopharyngeal Swab     Status: None   Collection Time: 11/23/19  5:18 AM   Specimen: Nasopharyngeal Swab  Result Value Ref Range Status   SARS Coronavirus 2 NEGATIVE NEGATIVE Final    Comment: (NOTE) SARS-CoV-2 target nucleic acids are NOT DETECTED.  The SARS-CoV-2 RNA is generally detectable in upper and lower respiratory specimens during the acute phase of infection. The lowest concentration of SARS-CoV-2 viral copies this assay can detect is 250 copies / mL. A negative result does not preclude SARS-CoV-2 infection and should not be used as the sole basis for treatment or other patient management decisions.  A negative result may occur with improper  specimen collection / handling, submission of specimen other than nasopharyngeal swab, presence of viral mutation(s) within the areas targeted by this assay, and inadequate number of viral copies (<250 copies / mL). A negative result must be combined with clinical observations, patient history, and epidemiological information.  Fact Sheet for Patients:   BoilerBrush.com.cy  Fact Sheet for Healthcare Providers: https://pope.com/  This test is not yet approved or  cleared by the [place] FDA and has been authorized for detection and/or diagnosis of SARS-CoV-2 by FDA under an Emergency Use Authorization (EUA).  This EUA will remain in effect (meaning this test can be used) for the duration of the COVID-19 declaration under Section 564(b)(1) of the Act, 21 U.S.C. section 360bbb-3(b)(1), unless the authorization is terminated or revoked sooner.  Performed at [facility], [address], [place], [place] [address]      Radiological Exams on Admission: DG Chest 2 View  Result Date: 11/22/2019 CLINICAL DATA:  Severe lower mid chest pain for 1 day, difficulty breathing EXAM: CHEST - 2 VIEW COMPARISON:  11/15/2019 FINDINGS: Frontal and lateral views of the chest demonstrate a stable cardiac silhouette allowing for differences in positioning and technique. No acute airspace disease, effusion, or pneumothorax. Stable atherosclerosis of the aorta. No acute bony abnormalities. IMPRESSION: 1. No acute intrathoracic process. Electronically Signed   By: Sharlet Salina M.D.   On: 11/22/2019 19:26    EKG: Independently reviewed.  Atrial fibrillation at 106 bpm  Assessment/Plan Shortness of breath Cyst congestive heart failure exacerbation: Acute.  Patient presents with complaints of shortness of breath fully worse with laying down at night over the last week.  X-ray showed no acute abnormalities.  O2 saturations were maintained on room air,  but patient was placed on 2 L of nasal cannula oxygen for comfort.  BNP was noted to be elevated up to 687.3. -Admit to a telemetry bed -Strict intake and output -Daily weights -Check echocardiogram(EF noted to be around 40-45%) -Lasix 40 mg IV daily -Message sent for cardiology to evaluate in a.m.  SIRS: Acute. Patient was noted to initially be tachycardic and tachypneic with WBC elevated 11.3.  Chest x-ray and l urinalysis showed no acute abnormalities.  No initial lactic acid have  been obtained -Check lactic acid and blood cultures -Check respiratory virus panel -Recheck CBC in a.m.  Atrial fibrillation on chronic anticoagulation: Patient on home medications of Xarelto 20 mg nightly.  Heart rates currently in the low 100s. -Check TSH -Goal potassium 4 and magnesium 2 -Continue Xarelto and metoprolol  Elevated troponin: Acute.  High-sensitivity troponins 41->45.  Patient complained of having intermittent episodes of chest pain, but currently chest pain-free. -Continue to monitor telemetry overnight  Hyperglycemia: Acute.  Patient presents with glucose mildly elevated 180 on admission.  Home medications include Metformin 500 mg twice daily. -Hypoglycemic protocol -Add on hemoglobin A1c -CBGs before every meal with sensitive SSI  Recent UTI: Patient had been on cephalexin recently to treat for a UTI starting on 8/23.  Repeat urinalysis on admission negative for any acute abnormalities.   -Did not continue cephalexin  DNR: Present on admission.  DVT prophylaxis: Xarelto Code Status: DNR Family Communication: Discussed with patient's daughter Gavin Pound over the phone Disposition Plan: Likely discharge back to living facility once medically stable  Consults called: None Admission status: Inpatient  Clydie Braun MD [facility] Pager [phone]   If 7PM-7AM, please contact night-coverage [email or web address] Password [facility]  11/23/2019, 7:34 AM

## 2019-11-23 NOTE — ED Notes (Signed)
Placed a external cath patient is resting with call bell in reach 

## 2019-11-24 ENCOUNTER — Encounter (HOSPITAL_COMMUNITY): Payer: Self-pay | Admitting: Internal Medicine

## 2019-11-24 ENCOUNTER — Inpatient Hospital Stay (HOSPITAL_COMMUNITY): Payer: Medicare Other

## 2019-11-24 DIAGNOSIS — I5021 Acute systolic (congestive) heart failure: Secondary | ICD-10-CM

## 2019-11-24 LAB — CBC
HCT: 35.6 % — ABNORMAL LOW (ref 36.0–46.0)
Hemoglobin: 11.4 g/dL — ABNORMAL LOW (ref 12.0–15.0)
MCH: 28.5 pg (ref 26.0–34.0)
MCHC: 32 g/dL (ref 30.0–36.0)
MCV: 89 fL (ref 80.0–100.0)
Platelets: 326 10*3/uL (ref 150–400)
RBC: 4 MIL/uL (ref 3.87–5.11)
RDW: 13.8 % (ref 11.5–15.5)
WBC: 10.4 10*3/uL (ref 4.0–10.5)
nRBC: 0 % (ref 0.0–0.2)

## 2019-11-24 LAB — BASIC METABOLIC PANEL
Anion gap: 11 (ref 5–15)
BUN: 13 mg/dL (ref 8–23)
CO2: 25 mmol/L (ref 22–32)
Calcium: 9.2 mg/dL (ref 8.9–10.3)
Chloride: 101 mmol/L (ref 98–111)
Creatinine, Ser: 0.77 mg/dL (ref 0.44–1.00)
GFR calc Af Amer: >60 mL/min (ref >60)
GFR calc non Af Amer: >60 mL/min (ref >60)
Glucose, Bld: 187 mg/dL — ABNORMAL HIGH (ref 70–99)
Potassium: 3.8 mmol/L (ref 3.5–5.1)
Sodium: 137 mmol/L (ref 135–145)

## 2019-11-24 LAB — GLUCOSE, CAPILLARY
Glucose-Capillary: 160 mg/dL — ABNORMAL HIGH (ref 70–99)
Glucose-Capillary: 164 mg/dL — ABNORMAL HIGH (ref 70–99)
Glucose-Capillary: 284 mg/dL — ABNORMAL HIGH (ref 70–99)
Glucose-Capillary: 251 mg/dL — ABNORMAL HIGH (ref 70–99)

## 2019-11-24 LAB — MISC LABCORP TEST (SEND OUT): Labcorp test code: 139650

## 2019-11-24 MED ORDER — METOPROLOL SUCCINATE ER 100 MG PO TB24
100.0000 mg | ORAL_TABLET | Freq: Once | ORAL | Status: AC
  Administered 2019-11-24: 100 mg via ORAL
  Filled 2019-11-24: qty 1

## 2019-11-24 MED ORDER — ALBUTEROL SULFATE (2.5 MG/3ML) 0.083% IN NEBU
2.5000 mg | INHALATION_SOLUTION | Freq: Three times a day (TID) | RESPIRATORY_TRACT | Status: DC
  Administered 2019-11-24 – 2019-11-25 (×3): 2.5 mg via RESPIRATORY_TRACT
  Filled 2019-11-24 (×4): qty 3

## 2019-11-24 MED ORDER — METHYLPREDNISOLONE SODIUM SUCC 40 MG IJ SOLR
40.0000 mg | Freq: Two times a day (BID) | INTRAMUSCULAR | Status: DC
  Administered 2019-11-24 – 2019-11-26 (×5): 40 mg via INTRAVENOUS
  Filled 2019-11-24 (×5): qty 1

## 2019-11-24 MED ORDER — METOPROLOL SUCCINATE ER 100 MG PO TB24
200.0000 mg | ORAL_TABLET | Freq: Every day | ORAL | Status: DC
  Administered 2019-11-25 – 2019-11-26 (×2): 200 mg via ORAL
  Filled 2019-11-24 (×2): qty 2

## 2019-11-24 MED ORDER — ALBUTEROL SULFATE (2.5 MG/3ML) 0.083% IN NEBU
2.5000 mg | INHALATION_SOLUTION | RESPIRATORY_TRACT | Status: DC
  Administered 2019-11-24: 2.5 mg via RESPIRATORY_TRACT
  Filled 2019-11-24: qty 3

## 2019-11-24 MED ORDER — FUROSEMIDE 10 MG/ML IJ SOLN
40.0000 mg | Freq: Two times a day (BID) | INTRAMUSCULAR | Status: DC
  Administered 2019-11-24 (×2): 40 mg via INTRAVENOUS
  Filled 2019-11-24 (×2): qty 4

## 2019-11-24 MED ORDER — ALBUTEROL SULFATE (2.5 MG/3ML) 0.083% IN NEBU
2.5000 mg | INHALATION_SOLUTION | RESPIRATORY_TRACT | Status: DC | PRN
  Administered 2019-11-26 (×2): 2.5 mg via RESPIRATORY_TRACT
  Filled 2019-11-24 (×2): qty 3

## 2019-11-24 MED ORDER — SODIUM CHLORIDE 0.9 % IV SOLN
100.0000 mg | Freq: Two times a day (BID) | INTRAVENOUS | Status: DC
  Administered 2019-11-24 – 2019-11-25 (×4): 100 mg via INTRAVENOUS
  Filled 2019-11-24 (×6): qty 100

## 2019-11-24 MED ORDER — LOSARTAN POTASSIUM 25 MG PO TABS
25.0000 mg | ORAL_TABLET | Freq: Every day | ORAL | Status: DC
  Administered 2019-11-24 – 2019-11-27 (×3): 25 mg via ORAL
  Filled 2019-11-24 (×4): qty 1

## 2019-11-24 NOTE — Progress Notes (Signed)
SATURATION QUALIFICATIONS: (This note is used to comply with regulatory documentation for home oxygen)  Patient Saturations on Room Air at Rest = 91%  Patient Saturations on Room Air while Ambulating = 87%  Patient Saturations on 1Liters of oxygen while Ambulating = 94%  Please briefly explain why patient needs home oxygen:

## 2019-11-24 NOTE — Progress Notes (Signed)
[facility] PROGRESS NOTE    Progress Note  Lindsey [name]  ZOX:[id number] DOB: 1930-07-28 DOA: 11/22/2019 PCP: Patient, No Pcp Per     Brief Narrative:   Lindsey [name] is an 86 y.o. female past medical history significant for essential hypertension, paroxysmal atrial fibrillation on Eliquis, diabetes mellitus type 2 comes in complaining of shortness of breath that started 1 week prior to admission.  She was recently seen in the ED on 11/15/2019 with reports of not feeling well was found to have a UTI started on antibiotics.  In the ED she was found to have a temperature of 100.4, pulse of 111 with breathing about 28 times per minute, with a white count of 11.3, hemoglobin of 11 high sensitive troponin just barely elevated, D-dimer of 0.5, pro-Cal less than 0.1, CRP of 2.4, BNP of 687 chest x-ray showed no acute abnormalities.  COVID-19 PCR was negative.  Assessment/Plan:   Acute respiratory failure with hypoxia multifactorial in the setting of acute CHF (congestive heart failure) (HCC) and mild COPD exacerbation: The echo done on 11/23/2019 that showed an EF of 40%.Saturations greater 95% on room air. Chest x-ray shows no acute abnormalities, pain, she had a mild leukocytosis on admission which is now resolved, her procalcitonin was less than 0.1, lactic acid of 1.3. Weight appears about the same as on previous admission. Continue IV Lasix she has crackles bilaterally with wheezing and poor air movement on physical exam cannot appreciate JVD. Strict I's and O's and daily weights.  She has negative about 500 cc her creatinine has hold steady. She has orthopnea, she does not want the head of her bed down. We will go ahead and start her empirically on IV steroids and IV antibiotics, schedule albuterol and as needed. Unlikely PE as she is on Xarelto. We will check a CT of the chest without contrast.  SIRS: She was found to be tachycardic tachypneic with a white count of 11.3,  temperature of 100.4. Lactic acid below 2, blood cultures has been sent, recheck CBC shows a white count is improving. Respiratory panel is pending.  Chronic atrial fibrillation: Continue Xarelto, TSH unremarkable, will keep her potassium greater than 4 magnesium greater than 2. Currently rate controlled continue metoprolol.  Elevated troponins: Has basically been flat, she denies any chest pain likely demand ischemia.  Diabetes mellitus type 2: At home she is on Metformin which has been held, she was started on the hyperglycemic protocol. A1c is 6.9 currently on sliding scale.     DVT prophylaxis: lovenxo Family Communication:none Status is: Inpatient  Remains inpatient appropriate because:Hemodynamically unstable   Dispo: The patient is from: Home              Anticipated d/c is to: SNF              Anticipated d/c date is: 2 days              Patient currently is not medically stable to d/c.        Code Status:     Code Status Orders  (From admission, onward)         Start     Ordered   11/23/19 0957  Do not attempt resuscitation (DNR)  Continuous       Question Answer Comment  In the event of cardiac or respiratory ARREST Do not call a "code blue"   In the event of cardiac or respiratory ARREST Do not perform Intubation, CPR, defibrillation or ACLS  In the event of cardiac or respiratory ARREST Use medication by any route, position, wound care, and other measures to relive pain and suffering. May use oxygen, suction and manual treatment of airway obstruction as needed for comfort.      11/23/19 0956        Code Status History    Date Active Date Inactive Code Status Order ID Comments User Context   11/23/2019 0831 11/23/2019 0956 Full Code [id number]  Clydie Braun, MD ED   Advance Care Planning Activity    Advance Directive Documentation     Most Recent Value  Type of Advance Directive --  Pre-existing out of facility DNR order (yellow form or pink  MOST form) Pink Most/Yellow Form available - Physician notified to receive inpatient order, Yellow form placed in chart (order not valid for inpatient use)  "MOST" Form in Place? --        IV Access:    Peripheral IV   Procedures and diagnostic studies:   DG Chest 2 View  Result Date: 11/22/2019 CLINICAL DATA:  Severe lower mid chest pain for 1 day, difficulty breathing EXAM: CHEST - 2 VIEW COMPARISON:  11/15/2019 FINDINGS: Frontal and lateral views of the chest demonstrate a stable cardiac silhouette allowing for differences in positioning and technique. No acute airspace disease, effusion, or pneumothorax. Stable atherosclerosis of the aorta. No acute bony abnormalities. IMPRESSION: 1. No acute intrathoracic process. Electronically Signed   By: Sharlet Salina M.D.   On: 11/22/2019 19:26   ECHOCARDIOGRAM COMPLETE  Result Date: 11/23/2019    ECHOCARDIOGRAM REPORT   Patient Name:   Lindsey [name] Date of Exam: 11/23/2019 Medical Rec #:  [id number]        Height:       67.0 in Accession #:    [id number]       Weight:       158.0 lb Date of Birth:  14-Aug-1930        BSA:          1.829 m Patient Age:    89 years         BP:           155/73 mmHg Patient Gender: F                HR:           94 bpm. Exam Location:  Inpatient Procedure: 2D Echo, Cardiac Doppler and Color Doppler Indications:    Atrial Fibrillation 427.31 / I48.91  History:        Patient has no prior history of Echocardiogram examinations.                 Risk Factors:Hypertension.  Sonographer:    Eulah Pont RDCS Referring Phys: [id number] RONDELL A SMITH IMPRESSIONS  1. Left ventricular ejection fraction, by estimation, is 40 to 45%. The left ventricle has mildly decreased function. The left ventricle demonstrates regional wall motion abnormalities (see scoring diagram/findings for description). There is moderate asymmetric left ventricular hypertrophy of the basal-septal segment. Left ventricular diastolic function could not be  evaluated. There is severe hypokinesis of the left ventricular, entire septal wall, apical segment and inferoseptal wall.  2. Right ventricular systolic function is mildly reduced. The right ventricular size is normal.  3. Left atrial size was severely dilated.  4. The mitral valve is abnormal. Mild mitral valve regurgitation.  5. The aortic valve is tricuspid. Aortic valve regurgitation is mild. Mild aortic valve sclerosis is present, with  no evidence of aortic valve stenosis. FINDINGS  Left Ventricle: Left ventricular ejection fraction, by estimation, is 40 to 45%. The left ventricle has mildly decreased function. The left ventricle demonstrates regional wall motion abnormalities. Severe hypokinesis of the left ventricular, entire septal wall, apical segment and inferoseptal wall. The left ventricular internal cavity size was normal in size. There is moderate asymmetric left ventricular hypertrophy of the basal-septal segment. Left ventricular diastolic function could not be evaluated due to atrial fibrillation. Left ventricular diastolic function could not be evaluated. Right Ventricle: The right ventricular size is normal. No increase in right ventricular wall thickness. Right ventricular systolic function is mildly reduced. Left Atrium: Left atrial size was severely dilated. Right Atrium: Right atrial size was normal in size. Pericardium: There is no evidence of pericardial effusion. Mitral Valve: The mitral valve is abnormal. There is mild thickening of the mitral valve leaflet(s). Mild mitral annular calcification. Mild mitral valve regurgitation. Tricuspid Valve: The tricuspid valve is grossly normal. Tricuspid valve regurgitation is trivial. Aortic Valve: The aortic valve is tricuspid. Aortic valve regurgitation is mild. Aortic regurgitation PHT measures 456 msec. Mild aortic valve sclerosis is present, with no evidence of aortic valve stenosis. Pulmonic Valve: The pulmonic valve was not well visualized.  Pulmonic valve regurgitation is not visualized. Aorta: The aortic root and ascending aorta are structurally normal, with no evidence of dilitation. IAS/Shunts: No atrial level shunt detected by color flow Doppler.  LEFT VENTRICLE PLAX 2D LVIDd:         5.10 cm LVIDs:         3.60 cm LV PW:         1.20 cm LV IVS:        1.20 cm LVOT diam:     2.20 cm LV SV:         45 LV SV Index:   25 LVOT Area:     3.80 cm  LV Volumes (MOD) LV vol d, MOD A2C: 69.8 ml LV vol d, MOD A4C: 78.7 ml LV vol s, MOD A2C: 43.4 ml LV vol s, MOD A4C: 46.0 ml LV SV MOD A2C:     26.4 ml LV SV MOD A4C:     78.7 ml LV SV MOD BP:      31.6 ml RIGHT VENTRICLE TAPSE (M-mode): 1.0 cm LEFT ATRIUM              Index       RIGHT ATRIUM           Index LA diam:        4.80 cm  2.62 cm/m  RA Area:     19.50 cm LA Vol (A2C):   117.0 ml 63.95 ml/m RA Volume:   49.00 ml  26.78 ml/m LA Vol (A4C):   83.9 ml  45.86 ml/m LA Biplane Vol: 98.5 ml  53.84 ml/m  AORTIC VALVE LVOT Vmax:   72.90 cm/s LVOT Vmean:  51.000 cm/s LVOT VTI:    0.118 m AI PHT:      456 msec  AORTA Ao Root diam: 3.10 cm Ao Asc diam:  3.00 cm MR Peak grad:    109.4 mmHg  TRICUSPID VALVE MR Mean grad:    68.0 mmHg   TR Peak grad:   31.1 mmHg MR Vmax:         523.00 cm/s TR Vmax:        279.00 cm/s MR Vmean:        388.0 cm/s MR PISA:  0.57 cm    SHUNTS MR PISA Eff ROA: 4 mm       Systemic VTI:  0.12 m MR PISA Radius:  0.30 cm     Systemic Diam: 2.20 cm Lindsey Shutter MD Electronically signed by Lindsey Shutter MD Signature Date/Time: 11/23/2019/4:42:56 PM    Final      Medical Consultants:    None.  Anti-Infectives:   doxy  Subjective:    Lindsey [name] place her breathing is not improved, she relates orthopnea.  Objective:    Vitals:   11/23/19 1716 11/24/19 0107 11/24/19 0200 11/24/19 0441  BP:  121/76  (!) 138/92  Pulse:  95  95  Resp:  20  20  Temp:  98 F (36.7 C)  97.8 F (36.6 C)  TempSrc:  Oral  Oral  SpO2:  98%  94%  Weight: 70.9 kg  70.7 kg    Height: 5\' 7"  (1.702 m)      SpO2: 94 % O2 Flow Rate (L/min): 1 L/min   Intake/Output Summary (Last 24 hours) at 11/24/2019 0745 Last data filed at 11/24/2019 0631 Gross per 24 hour  Intake 240 ml  Output 800 ml  Net -560 ml   Filed Weights   11/23/19 1716 11/24/19 0200  Weight: 70.9 kg 70.7 kg    Exam: General exam: In no acute distress. Respiratory system: Moderate air movement with crackles bilaterally and diffuse wheezing. Cardiovascular system: S1 & S2 heard, RRR. No JVD. Gastrointestinal system: Abdomen is nondistended, soft and nontender.  Extremities: No pedal edema. Skin: No rashes, lesions or ulcers Psychiatry: Judgement and insight appear normal. Mood & affect appropriate.    Data Reviewed:    Labs: Basic Metabolic Panel: Recent Labs  Lab 11/22/19 1814  NA 137  K 3.8  CL 103  CO2 24  GLUCOSE 180*  BUN 14  CREATININE 0.69  CALCIUM 9.2   GFR Estimated Creatinine Clearance: 48.2 mL/min (by C-G formula based on SCr of 0.69 mg/dL). Liver Function Tests: No results for input(s): AST, ALT, ALKPHOS, BILITOT, PROT, ALBUMIN in the last 168 hours. No results for input(s): LIPASE, AMYLASE in the last 168 hours. No results for input(s): AMMONIA in the last 168 hours. Coagulation profile No results for input(s): INR, PROTIME in the last 168 hours. COVID-19 Labs  Recent Labs    11/23/19 0518  DDIMER 0.57*  FERRITIN 65  LDH 182  CRP 2.4*    Lab Results  Component Value Date   SARSCOV2NAA NEGATIVE 11/23/2019   SARSCOV2NAA NEGATIVE 11/15/2019   SARSCOV2NAA Not Detected 06/15/2019    CBC: Recent Labs  Lab 11/22/19 1814 11/24/19 0702  WBC 11.3* 10.4  HGB 11.1* 11.4*  HCT 35.2* 35.6*  MCV 88.9 89.0  PLT 369 326   Cardiac Enzymes: No results for input(s): CKTOTAL, CKMB, CKMBINDEX, TROPONINI in the last 168 hours. BNP (last 3 results) No results for input(s): PROBNP in the last 8760 hours. CBG: Recent Labs  Lab 11/23/19 1331 11/23/19 1715  11/23/19 2103 11/24/19 0628  GLUCAP 143* 192* 195* 160*   D-Dimer: Recent Labs    11/23/19 0518  DDIMER 0.57*   Hgb A1c: Recent Labs    11/23/19 1850  HGBA1C 6.9*   Lipid Profile: No results for input(s): CHOL, HDL, LDLCALC, TRIG, CHOLHDL, LDLDIRECT in the last 72 hours. Thyroid function studies: Recent Labs    11/23/19 1850  TSH 0.991   Anemia work up: Recent Labs    11/23/19 0518  FERRITIN 65   Sepsis Labs:  Recent Labs  Lab 11/22/19 1814 11/23/19 0518 11/23/19 1850 11/24/19 0702  PROCALCITON  --  <0.10  --   --   WBC 11.3*  --   --  10.4  LATICACIDVEN  --   --  1.3  --    Microbiology Recent Results (from the past 240 hour(s))  SARS Coronavirus 2 by RT PCR (hospital order, performed in [facility] lab) Nasopharyngeal Nasopharyngeal Swab     Status: None   Collection Time: 11/15/19  3:36 PM   Specimen: Nasopharyngeal Swab  Result Value Ref Range Status   SARS Coronavirus 2 NEGATIVE NEGATIVE Final    Comment: (NOTE) SARS-CoV-2 target nucleic acids are NOT DETECTED.  The SARS-CoV-2 RNA is generally detectable in upper and lower respiratory specimens during the acute phase of infection. The lowest concentration of SARS-CoV-2 viral copies this assay can detect is 250 copies / mL. A negative result does not preclude SARS-CoV-2 infection and should not be used as the sole basis for treatment or other patient management decisions.  A negative result may occur with improper specimen collection / handling, submission of specimen other than nasopharyngeal swab, presence of viral mutation(s) within the areas targeted by this assay, and inadequate number of viral copies (<250 copies / mL). A negative result must be combined with clinical observations, patient history, and epidemiological information.  Fact Sheet for Patients:   BoilerBrush.com.cyhttps://www.fda.gov/media/136312/download  Fact Sheet for Healthcare  Providers: https://pope.com/https://www.fda.gov/media/136313/download  This test is not yet approved or  cleared by the [place] FDA and has been authorized for detection and/or diagnosis of SARS-CoV-2 by FDA under an Emergency Use Authorization (EUA).  This EUA will remain in effect (meaning this test can be used) for the duration of the COVID-19 declaration under Section 564(b)(1) of the Act, 21 U.S.C. section 360bbb-3(b)(1), unless the authorization is terminated or revoked sooner.  Performed at [facility], [address] [place], [place] [address]   Urine Culture     Status: Abnormal   Collection Time: 11/15/19  6:09 PM   Specimen: Urine, Random  Result Value Ref Range Status   Specimen Description   Final    URINE, RANDOM Performed at [facility], [address] [place], [place] [address]    Special Requests   Final    NONE Performed at [facility], [address] [place], [place] [address]    Culture MULTIPLE SPECIES PRESENT, SUGGEST RECOLLECTION (A)  Final   Report Status 11/16/2019 FINAL  Final  SARS Coronavirus 2 by RT PCR (hospital order, performed in [facility] hospital lab) Nasopharyngeal Nasopharyngeal Swab     Status: None   Collection Time: 11/23/19  5:18 AM   Specimen: Nasopharyngeal Swab  Result Value Ref Range Status   SARS Coronavirus 2 NEGATIVE NEGATIVE Final    Comment: (NOTE) SARS-CoV-2 target nucleic acids are NOT DETECTED.  The SARS-CoV-2 RNA is generally detectable in upper and lower respiratory specimens during the acute phase of infection. The lowest concentration of SARS-CoV-2 viral copies this assay can detect is 250 copies / mL. A negative result does not preclude SARS-CoV-2 infection and should not be used as the sole basis for treatment or other patient management decisions.  A negative result may occur with improper specimen collection / handling, submission of specimen other than  nasopharyngeal swab, presence of viral mutation(s) within the areas targeted by this assay, and inadequate number of viral copies (<250 copies / mL). A negative result must be  combined with clinical observations, patient history, and epidemiological information.  Fact Sheet for Patients:   BoilerBrush.com.cy  Fact Sheet for Healthcare Providers: https://pope.com/  This test is not yet approved or  cleared by the [place] FDA and has been authorized for detection and/or diagnosis of SARS-CoV-2 by FDA under an Emergency Use Authorization (EUA).  This EUA will remain in effect (meaning this test can be used) for the duration of the COVID-19 declaration under Section 564(b)(1) of the Act, 21 U.S.C. section 360bbb-3(b)(1), unless the authorization is terminated or revoked sooner.  Performed at [facility], [address], [place], [place] [address]      Medications:   . aspirin  81 mg Oral Daily  . atorvastatin  10 mg Oral Daily  . docusate sodium  100 mg Oral QODAY  . DULoxetine  60 mg Oral Daily  . furosemide  40 mg Intravenous Daily  . gabapentin  100 mg Oral TID  . insulin aspart  0-6 Units Subcutaneous TID WC  . meclizine  12.5 mg Oral Daily  . metoprolol succinate  100 mg Oral Daily  . multivitamin with minerals  1 tablet Oral q morning - 10a  . pantoprazole  40 mg Oral q morning - 10a  . potassium chloride  10 mEq Oral Q M,W,F  . rivaroxaban  20 mg Oral Daily  . sodium chloride flush  3 mL Intravenous Q12H  . tamsulosin  0.4 mg Oral q morning - 10a   Continuous Infusions:    LOS: 1 day   Marinda Elk  [facility]  11/24/2019, 7:45 AM

## 2019-11-24 NOTE — Evaluation (Signed)
Physical Therapy Evaluation Patient Details Name: Lindsey [name] MRN: [id number] DOB: 08-28-1930 Today's Date: 11/24/2019   History of Present Illness  86 y.o. female past medical history significant for essential hypertension, paroxysmal atrial fibrillation on Eliquis, diabetes mellitus type 2 admitted to ED on 8/29 with  shortness of breath x1 week. working diagnoses of HF and COPD exacerbation. Prior ED visit on 11/15/2019 with UTI started on antibiotics.  Clinical Impression   Pt presents with generalized weakness, increased time and effort to mobilize, impaired balance, O2 desaturation with mobility, and decreased activity tolerance. Pt to benefit from acute PT to address deficits. Pt ambulated room distance with use of RW, requiring cuing for form and safety with use of RW. Pt at ALF prior to admission, states she hardly got OOB. PT recommending SNF level of care to maximize mobility and return to PLOF. PT to progress mobility as tolerated, and will continue to follow acutely.   SATURATION QUALIFICATIONS: (This note is used to comply with regulatory documentation for home oxygen)  Patient Saturations on Room Air at Rest = 91%  Patient Saturations on Room Air while Ambulating = 87%  Patient Saturations on 1 Liters of oxygen while Ambulating = 94%  Please briefly explain why patient needs home oxygen: to maintain SpO2    Follow Up Recommendations SNF;Supervision for mobility/OOB    Equipment Recommendations  None recommended by PT    Recommendations for Other Services       Precautions / Restrictions Precautions Precautions: Fall Restrictions Weight Bearing Restrictions: No      Mobility  Bed Mobility Overal bed mobility: Needs Assistance Bed Mobility: Supine to Sit     Supine to sit: Min assist     General bed mobility comments: min assist to elevate trunk, increased time and effort.  Transfers Overall transfer level: Needs assistance Equipment used: Rolling  walker (2 wheeled) Transfers: Sit to/from Stand Sit to Stand: Min guard         General transfer comment: x2 from EOB, x1 from recliner. Min guard for safety, increased time to rise and steady. Verbal cuing for safe hand placement.  Ambulation/Gait Ambulation/Gait assistance: Min guard Gait Distance (Feet): 15 Feet Assistive device: Rolling walker (2 wheeled) Gait Pattern/deviations: Step-through pattern;Decreased stride length;Trunk flexed Gait velocity: decr   General Gait Details: Min gaurd for safety, PT managing lines/leads. Verbal/tactile cuing for upright posture. SpO2 87% pn RA, required 1LO2 to recover sats 90-94%.  Stairs            Wheelchair Mobility    Modified Rankin (Stroke Patients Only)       Balance Overall balance assessment: Needs assistance Sitting-balance support: No upper extremity supported;Feet supported Sitting balance-Leahy Scale: Fair Sitting balance - Comments: able to sit EOB and edge of chair without external or UE support   Standing balance support: Bilateral upper extremity supported;During functional activity Standing balance-Leahy Scale: Fair Standing balance comment: able to stand statically without UE support, requires RW dynamically                             Pertinent Vitals/Pain Pain Assessment: No/denies pain    Home Living Family/patient expects to be discharged to:: Assisted living Living Arrangements: Alone   Type of Home: Assisted living Home Access: Level entry     Home Layout: One level Home Equipment: Walker - 4 wheels      Prior Function Level of Independence: Needs assistance   Gait / Transfers  Assistance Needed: Pt reports short distance ambulation with use of rollator, states for the past two weeks at ALF pt "hasn't been doing anything".  ADL's / Homemaking Assistance Needed: pt reports assist for washing back and feet when bathing, otherwise performs ADLs for self.  Comments: Pt  previously living with daughter up until 2 weeks ago, placed in ALF     Hand Dominance   Dominant Hand: Right    Extremity/Trunk Assessment   Upper Extremity Assessment Upper Extremity Assessment: Defer to OT evaluation    Lower Extremity Assessment Lower Extremity Assessment: Generalized weakness    Cervical / Trunk Assessment Cervical / Trunk Assessment: Normal  Communication   Communication: HOH  Cognition Arousal/Alertness: Awake/alert Behavior During Therapy: WFL for tasks assessed/performed Overall Cognitive Status: Within Functional Limits for tasks assessed                                        General Comments General comments (skin integrity, edema, etc.): urinary incontinence in recliner, requiring PT assist to change linens. Pt able to perform pericare. HRmax 124 bpm.    Exercises     Assessment/Plan    PT Assessment Patient needs continued PT services  PT Problem List Decreased strength;Decreased mobility;Decreased safety awareness;Decreased activity tolerance;Decreased balance;Decreased knowledge of use of DME;Cardiopulmonary status limiting activity       PT Treatment Interventions DME instruction;Therapeutic exercise;Gait training;Balance training;Neuromuscular re-education;Functional mobility training;Therapeutic activities;Patient/family education    PT Goals (Current goals can be found in the Care Plan section)  Acute Rehab PT Goals PT Goal Formulation: With patient Time For Goal Achievement: 12/08/19 Potential to Achieve Goals: Good    Frequency Min 2X/week   Barriers to discharge        Co-evaluation               AM-PAC PT "6 Clicks" Mobility  Outcome Measure Help needed turning from your back to your side while in a flat bed without using bedrails?: A Little Help needed moving from lying on your back to sitting on the side of a flat bed without using bedrails?: A Little Help needed moving to and from a bed to a  chair (including a wheelchair)?: A Little Help needed standing up from a chair using your arms (e.g., wheelchair or bedside chair)?: A Little Help needed to walk in hospital room?: A Little Help needed climbing 3-5 steps with a railing? : A Lot 6 Click Score: 17    End of Session   Activity Tolerance: Patient tolerated treatment well;Patient limited by fatigue Patient left: in chair;with chair alarm set;with call bell/phone within reach Nurse Communication: Mobility status PT Visit Diagnosis: Other abnormalities of gait and mobility (R26.89);Difficulty in walking, not elsewhere classified (R26.2)    Time: 1129-1203 PT Time Calculation (min) (ACUTE ONLY): 34 min   Charges:   PT Evaluation $PT Eval Low Complexity: 1 Low PT Treatments $Gait Training: 8-22 mins       Aivan Fillingim E, PT Acute Rehabilitation Services Pager [phone]  Office [phone]  Zeba Luby D Despina Hidden 11/24/2019, 12:42 PM

## 2019-11-24 NOTE — Evaluation (Addendum)
Occupational Therapy Evaluation Patient Details Name: Lindsey [name] MRN: [id number] DOB: March 28, 1930 Today's Date: 11/24/2019    History of Present Illness 86 y.o. female past medical history significant for essential hypertension, paroxysmal atrial fibrillation on Eliquis, diabetes mellitus type 2 admitted to ED on 8/29 with  shortness of breath x1 week. working diagnoses of HF and COPD exacerbation. Prior ED visit on 11/15/2019 with UTI started on antibiotics.   Clinical Impression   This 86 y/o female presents with the above. Pt most recently residing in ALF PTA, performing majority of ADL tasks with mod independence. Pt fatigued this PM, agreeable to OT session but only able to tolerate minimal activity. Pt requiring up to minA for functional mobility using RW, minA for LB ADL. Pt with some confusion this PM and requiring increased time for processing/understanding questions and instruction (pt also very HOH so likely playing a part). She will benefit from continued acute OT services and currently recommend SNF level therapies to progress pt towards her previous mod independent level. SpO2 >/=93% on 1L throughout.     Follow Up Recommendations  SNF;Supervision/Assistance - 24 hour    Equipment Recommendations  Tub/shower seat           Precautions / Restrictions Precautions Precautions: Fall Restrictions Weight Bearing Restrictions: No      Mobility Bed Mobility               General bed mobility comments: OOB in recliner   Transfers Overall transfer level: Needs assistance Equipment used: Rolling walker (2 wheeled) Transfers: Sit to/from Stand Sit to Stand: Min guard         General transfer comment: x3 from recliner, verbal cues for controlled descent as pt "plopping" into chair with second sit<>stand     Balance Overall balance assessment: Needs assistance Sitting-balance support: No upper extremity supported;Feet supported Sitting balance-Leahy Scale:  Fair     Standing balance support: Bilateral upper extremity supported;During functional activity Standing balance-Leahy Scale: Fair                             ADL either performed or assessed with clinical judgement   ADL Overall ADL's : Needs assistance/impaired Eating/Feeding: Set up;Sitting   Grooming: Set up;Sitting   Upper Body Bathing: Supervision/ safety;Sitting   Lower Body Bathing: Minimal assistance;Sit to/from stand   Upper Body Dressing : Supervision/safety;Sitting   Lower Body Dressing: Minimal assistance;Sit to/from stand   Toilet Transfer: Minimal assistance;Stand-pivot;RW Toilet Transfer Details (indicate cue type and reason): limited in mobility due to fatigue  Toileting- Clothing Manipulation and Hygiene: Minimal assistance;Sit to/from stand       Functional mobility during ADLs: Minimal assistance;Rolling walker       Vision         Perception     Praxis      Pertinent Vitals/Pain Pain Assessment: No/denies pain     Hand Dominance Right   Extremity/Trunk Assessment Upper Extremity Assessment Upper Extremity Assessment: Generalized weakness   Lower Extremity Assessment Lower Extremity Assessment: Defer to PT evaluation   Cervical / Trunk Assessment Cervical / Trunk Assessment: Normal   Communication Communication Communication: HOH   Cognition Arousal/Alertness: Awake/alert Behavior During Therapy: WFL for tasks assessed/performed Overall Cognitive Status: No family/caregiver present to determine baseline cognitive functioning  General Comments: pt also very HOH, difficulty understanding/processing questions   General Comments  HR up to 106 during session    Exercises     Shoulder Instructions      Home Living Family/patient expects to be discharged to:: Assisted living Living Arrangements: Alone   Type of Home: Assisted living Home Access: Level entry     Home  Layout: One level     Bathroom Shower/Tub: Producer, television/film/video: Standard     Home Equipment: Environmental consultant - 4 wheels          Prior Functioning/Environment Level of Independence: Needs assistance  Gait / Transfers Assistance Needed: Pt reports short distance ambulation with use of rollator, states for the past two weeks at ALF pt "hasn't been doing anything". ADL's / Homemaking Assistance Needed: pt reports assist for washing back and feet when bathing, otherwise performs ADLs for self.   Comments: Pt previously living with daughter up until 2 weeks ago, placed in ALF        OT Problem List: Decreased strength;Decreased range of motion;Decreased activity tolerance;Impaired balance (sitting and/or standing);Decreased cognition;Decreased safety awareness;Decreased knowledge of use of DME or AE;Cardiopulmonary status limiting activity      OT Treatment/Interventions: Self-care/ADL training;Therapeutic exercise;DME and/or AE instruction;Energy conservation;Therapeutic activities;Patient/family education;Balance training;Cognitive remediation/compensation    OT Goals(Current goals can be found in the care plan section) Acute Rehab OT Goals Patient Stated Goal: none stated, agreeable to working with therapy  OT Goal Formulation: With patient Time For Goal Achievement: 12/08/19 Potential to Achieve Goals: Good  OT Frequency: Min 2X/week   Barriers to D/C:            Co-evaluation              AM-PAC OT "6 Clicks" Daily Activity     Outcome Measure Help from another person eating meals?: None Help from another person taking care of personal grooming?: A Little Help from another person toileting, which includes using toliet, bedpan, or urinal?: A Little Help from another person bathing (including washing, rinsing, drying)?: A Little Help from another person to put on and taking off regular upper body clothing?: A Little Help from another person to put on and taking  off regular lower body clothing?: A Little 6 Click Score: 19   End of Session Equipment Utilized During Treatment: Rolling walker;Oxygen (1L) Nurse Communication: Mobility status  Activity Tolerance: Patient tolerated treatment well;Patient limited by fatigue Patient left: in chair;with call bell/phone within reach;with chair alarm set  OT Visit Diagnosis: Muscle weakness (generalized) (M62.81);Unsteadiness on feet (R26.81)                Time: 6834-1962 OT Time Calculation (min): 13 min Charges:  OT General Charges $OT Visit: 1 Visit OT Evaluation $OT Eval Moderate Complexity: 1 Mod  Marcy Siren, OT Acute Rehabilitation Services Pager [phone] Office [phone]   Orlando Penner 11/24/2019, 4:43 PM

## 2019-11-24 NOTE — Consult Note (Signed)
Cardiology Consultation:   Patient ID: Lindsey [name] MRN: [id number]; DOB: 09-Oct-1930  Admit date: 11/22/2019 Date of Consult: 11/24/2019  Primary Care Provider: Patient, No Pcp Per [facility] Cardiologist: New [facility] Electrophysiologist:  None    Patient Profile:   Lindsey [name] is a 86 y.o. female with a hx of HTN, HLD, paroxysmal afib on Xarelto, DM2 who is being seen today for the evaluation of CHF and afib at the request of Dr. Robb Matar.  History of Present Illness:   Lindsey [name] has not been seen by [facility] in the past. She recently moved here in June from [place] and had not established care with a cardiologist. Patient said she previously followed with a Dr. Newt Lukes from ?[place]. She has a history of PAF on Xarelto. The patient says this is a relatively new diagnosis. She also reported prior coronary stenting within the last 5 years. Tried to confirm history by her daughter but unable to reach her. Patient lives in [facility] and able to perform ADLs. Remote smoking history. No alcohol or tobacco use. She was seen in the [facility] ED 8/22 with reports of not feeling well found to have UTI.   The patient returned to the ED at [facility] 11/23/19 for worsening shortness of breath for the last week with intermittent chest pain. SOB worse on exertion and laying down. Unable to lay flat, which was new for the patient. Reports weights have been stable. No LLE. Also had some intermittent sharp chest pain that resolved with deep breaths and rest. This was also new for her. Denied fever, chills, cough. No abd pain, nausea, or vomiting. EMS was called who administered 324mg  ASA and SL nitro x 1. Patient is not sure if nitro improved the pain.   In the ED BP 153/73, pulse 83, afebrile, RR 21, 95% O2. COVID negative. Labs showed creatinine 0.69, stable electrolytes, WBC 11.3, Hgb 11.1. D-dimer 0.57, fibrinogen 510. CRP 2.4. BNP 687. HS troponin 41>45. EKG showed afib RVR with  possible LVH diffuse TWI. CXR unremarkable.  Patient was admitted for further work-up.    Past Medical History:  Diagnosis Date  . Hypertension   . Vertigo     History reviewed. No pertinent surgical history.   Home Medications:  Prior to Admission medications   Medication Sig Start Date End Date Taking? Authorizing Provider  aspirin 81 MG chewable tablet Chew 81 mg by mouth every morning.    Yes [provider]  atorvastatin (LIPITOR) 10 MG tablet Take 10 mg by mouth every morning.    Yes [provider]  cephALEXin (KEFLEX) 500 MG capsule Take 2 capsules (1,000 mg total) by mouth 2 (two) times daily. 11/15/19  Yes 11/17/19, MD  diclofenac Sodium (VOLTAREN) 1 % GEL Apply 2 g topically daily as needed (pain).   Yes [provider]  docusate sodium (COLACE) 100 MG capsule Take 100 mg by mouth every other day.   Yes [provider]  DULoxetine (CYMBALTA) 60 MG capsule Take 60 mg by mouth every morning.    Yes [provider]  furosemide (LASIX) 40 MG tablet Take 40 mg by mouth every morning.    Yes [provider]  gabapentin (NEURONTIN) 100 MG capsule Take 100 mg by mouth 3 (three) times daily. 10a, 4p, 10p   Yes [provider]  meclizine (ANTIVERT) 12.5 MG tablet Take 12.5 mg by mouth See admin instructions. Take one tablet (12.5 mg) by mouth daily at 8am, may also  take one tablet (12.5 mg) every 12 hours as needed for dizziness - give 2nd dose at least 4 hours are morning scheduled dose   Yes [provider]  metFORMIN (GLUCOPHAGE) 500 MG tablet Take 500 mg by mouth in the morning and at bedtime.   Yes [provider]  metoprolol succinate (TOPROL-XL) 100 MG 24 hr tablet Take 100 mg by mouth every morning. Take with or immediately following a meal.    Yes [provider]  Multiple Vitamin (MULTIVITAMIN WITH MINERALS) TABS tablet Take 1 tablet by mouth every morning.    Yes [provider]   pantoprazole (PROTONIX) 40 MG tablet Take 40 mg by mouth every morning.    Yes [provider]  potassium chloride (KLOR-CON) 10 MEQ tablet Take 10 mEq by mouth every Monday, Wednesday, and Friday.   Yes [provider]  rivaroxaban (XARELTO) 20 MG TABS tablet Take 20 mg by mouth every morning.    Yes [provider]  SUMAtriptan (IMITREX) 50 MG tablet Take 50 mg by mouth every 2 (two) hours as needed for migraine.    Yes [provider]  tamsulosin (FLOMAX) 0.4 MG CAPS capsule Take 0.4 mg by mouth every morning.    Yes [provider]  tiZANidine (ZANAFLEX) 2 MG tablet Take 2 mg by mouth every morning.    Yes [provider]    Inpatient Medications: Scheduled Meds: . albuterol  2.5 mg Nebulization TID  . aspirin  81 mg Oral Daily  . atorvastatin  10 mg Oral Daily  . docusate sodium  100 mg Oral QODAY  . DULoxetine  60 mg Oral Daily  . furosemide  40 mg Intravenous BID  . gabapentin  100 mg Oral TID  . insulin aspart  0-6 Units Subcutaneous TID WC  . meclizine  12.5 mg Oral Daily  . methylPREDNISolone (SOLU-MEDROL) injection  40 mg Intravenous Q12H  . metoprolol succinate  100 mg Oral Daily  . multivitamin with minerals  1 tablet Oral q morning - 10a  . pantoprazole  40 mg Oral q morning - 10a  . potassium chloride  10 mEq Oral Q M,W,F  . rivaroxaban  20 mg Oral Daily  . sodium chloride flush  3 mL Intravenous Q12H  . tamsulosin  0.4 mg Oral q morning - 10a   Continuous Infusions: . doxycycline (VIBRAMYCIN) IV 100 mg (11/24/19 0931)   PRN Meds: acetaminophen **OR** acetaminophen, albuterol, diclofenac Sodium, hydrALAZINE, meclizine, ondansetron **OR** ondansetron (ZOFRAN) IV, SUMAtriptan  Allergies:    Allergies  Allergen Reactions  . Diazepam     Unknown reaction  . Drug Ingredient [Monosodium Glutamate]     MSG - Unknown reaction  . Iodinated Diagnostic Agents     Unknown reaction  . Nitrofurantoin     Unknown  reaction  . Pineapple     Unknown reaction  . Savella [Milnacipran]     Unknown reaction  . Shellfish Allergy     Unknown reaction  . Sulfa Antibiotics     Unknown reaction  . Tramadol     Unknown reaction    Social History:   Social History   Socioeconomic History  . Marital status: Unknown    Spouse name: Not on file  . Number of children: Not on file  . Years of education: Not on file  . Highest education level: Not on file  Occupational History  . Not on file  Tobacco Use  . Smoking status: Not on file  Substance and Sexual Activity  . Alcohol use: Not on file  . Drug use: Not on file  . Sexual activity: Not on file  Other Topics Concern  . Not on file  Social History Narrative  . Not on file   Social Determinants of Health   Financial Resource Strain:   . Difficulty of Paying Living Expenses: Not on file  Food Insecurity:   . Worried About Programme researcher, broadcasting/film/video in the Last Year: Not on file  . Ran Out of Food in the Last Year: Not on file  Transportation Needs:   . Lack of Transportation (Medical): Not on file  . Lack of Transportation (Non-Medical): Not on file  Physical Activity:   . Days of Exercise per Week: Not on file  . Minutes of Exercise per Session: Not on file  Stress:   . Feeling of Stress : Not on file  Social Connections:   . Frequency of Communication with Friends and Family: Not on file  . Frequency of Social Gatherings with Friends and Family: Not on file  . Attends Religious Services: Not on file  . Active Member of Clubs or Organizations: Not on file  . Attends Banker Meetings: Not on file  . Marital Status: Not on file  Intimate Partner Violence:   . Fear of Current or Ex-Partner: Not on file  . Emotionally Abused: Not on file  . Physically Abused: Not on file  . Sexually Abused: Not on file    Family History:   *History reviewed. No pertinent family history.   ROS:  Please see the history of present illness.    All other ROS reviewed and negative.     Physical Exam/Data:   Vitals:   11/24/19 0200 11/24/19 0441 11/24/19 0818 11/24/19 1121  BP:  (!) 138/92 140/78   Pulse:  95 (!) 54   Resp:  20 18   Temp:  97.8 F (36.6 C) 98.4 F (36.9 C)   TempSrc:  Oral Oral   SpO2:  94% 97% 96%  Weight: 70.7 kg     Height:        Intake/Output Summary (Last 24 hours) at 11/24/2019 1237 Last data filed at 11/24/2019 0800 Gross per 24 hour  Intake 480 ml  Output 800 ml  Net -320 ml   Last 3 Weights 11/24/2019 11/23/2019 09/30/2019  Weight (lbs) 155 lb 13.8 oz 156 lb 4.9 oz 158 lb  Weight (kg) 70.7 kg 70.9 kg 71.668 kg     Body mass index is 24.41 kg/m.  General:  Well nourished, well developed, in no acute distress HEENT: normal Lymph: no adenopathy Neck: no JVD Endocrine:  No thryomegaly Vascular: No carotid bruits; FA pulses 2+ bilaterally without bruits  Cardiac:  normal S1, S2; Irreg Irreg; no murmur  Lungs:  Diminished as bases Abd: soft, nontender, no hepatomegaly  Ext: no edema Musculoskeletal:  No deformities, BUE and BLE strength normal and equal Skin: warm and dry  Neuro:  CNs 2-12 intact, no focal abnormalities noted Psych:  Normal affect   EKG:  The EKG was personally reviewed and demonstrates:  Afib, 106bpm, diffuse TWI, q waves V1and V2 Telemetry:  Telemetry was personally reviewed and demonstrates:  Afib, HR 100-120  Relevant CV Studies: Echo 11/23/19  1. Left ventricular ejection fraction, by estimation, is 40 to 45%. The  left ventricle has mildly decreased function. The left ventricle  demonstrates regional wall motion abnormalities (see scoring  diagram/findings for description). There is  moderate  asymmetric left ventricular hypertrophy of the basal-septal segment. Left  ventricular diastolic function could not be evaluated. There is severe  hypokinesis of the left ventricular, entire septal wall, apical segment  and inferoseptal wall.  2. Right ventricular  systolic function is mildly reduced. The right  ventricular size is normal.  3. Left atrial size was severely dilated.  4. The mitral valve is abnormal. Mild mitral valve regurgitation.  5. The aortic valve is tricuspid. Aortic valve regurgitation is mild.  Mild aortic valve sclerosis is present, with no evidence of aortic valve  stenosis.   Laboratory Data:  High Sensitivity Troponin:   Recent Labs  Lab 11/22/19 1814 11/23/19 0210  TROPONINIHS 41* 45*     Chemistry Recent Labs  Lab 11/22/19 1814 11/24/19 0702  NA 137 137  K 3.8 3.8  CL 103 101  CO2 24 25  GLUCOSE 180* 187*  BUN 14 13  CREATININE 0.69 0.77  CALCIUM 9.2 9.2  GFRNONAA >60 >60  GFRAA >60 >60  ANIONGAP 10 11    No results for input(s): PROT, ALBUMIN, AST, ALT, ALKPHOS, BILITOT in the last 168 hours. Hematology Recent Labs  Lab 11/22/19 1814 11/24/19 0702  WBC 11.3* 10.4  RBC 3.96 4.00  HGB 11.1* 11.4*  HCT 35.2* 35.6*  MCV 88.9 89.0  MCH 28.0 28.5  MCHC 31.5 32.0  RDW 14.0 13.8  PLT 369 326   BNP Recent Labs  Lab 11/23/19 0518  BNP 687.3*    DDimer  Recent Labs  Lab 11/23/19 0518  DDIMER 0.57*     Radiology/Studies:  DG Chest 2 View  Result Date: 11/22/2019 CLINICAL DATA:  Severe lower mid chest pain for 1 day, difficulty breathing EXAM: CHEST - 2 VIEW COMPARISON:  11/15/2019 FINDINGS: Frontal and lateral views of the chest demonstrate a stable cardiac silhouette allowing for differences in positioning and technique. No acute airspace disease, effusion, or pneumothorax. Stable atherosclerosis of the aorta. No acute bony abnormalities. IMPRESSION: 1. No acute intrathoracic process. Electronically Signed   By: Sharlet SalinaMichael  Brown M.D.   On: 11/22/2019 19:26   CT CHEST WO CONTRAST  Result Date: 11/24/2019 CLINICAL DATA:  Chronic shortness of breath. EXAM: CT CHEST WITHOUT CONTRAST TECHNIQUE: Multidetector CT imaging of the chest was performed following the standard protocol without IV  contrast. COMPARISON:  Chest radiograph 11/22/2019 FINDINGS: Cardiovascular: Mild cardiomegaly. No pericardial effusion. Coronary artery and aortic calcified atherosclerosis. Mediastinum/Nodes: No enlarged mediastinal or axillary lymph nodes. Thyroid gland, trachea, and esophagus demonstrate no significant findings. Lungs/Pleura: Diffuse bronchial wall thickening. Ground-glass opacities within the dependent lungs and lingula, compatible with atelectasis. Mild interlobular septal thickening, most conspicuous in the lung bases. Small bilateral pleural effusions. No pneumothorax Upper Abdomen: No acute abnormality. Calcifications along the left diaphragm. Musculoskeletal: Multilevel degenerative changes of the spine. No evidence of acute osseous abnormality. IMPRESSION: 1. Mild interstitial edema with small bilateral pleural effusions and mild cardiomegaly. 2. Diffuse bronchial wall thickening, as can be seen with bronchitis. 3. Atelectasis without focal consolidation. Electronically Signed   By: Feliberto HartsFrederick S Jones MD   On: 11/24/2019 10:34   ECHOCARDIOGRAM COMPLETE  Result Date: 11/23/2019    ECHOCARDIOGRAM REPORT   Patient Name:   Lindsey DriversROSETTE Servidio Date of Exam: 11/23/2019 Medical Rec #:  [id number]        Height:       67.0 in Accession #:    [id number]       Weight:       158.0 lb Date of  Birth:  01-Jul-1930        BSA:          1.829 m Patient Age:    89 years         BP:           155/73 mmHg Patient Gender: F                HR:           94 bpm. Exam Location:  Inpatient Procedure: 2D Echo, Cardiac Doppler and Color Doppler Indications:    Atrial Fibrillation 427.31 / I48.91  History:        Patient has no prior history of Echocardiogram examinations.                 Risk Factors:Hypertension.  Sonographer:    Eulah Pont RDCS Referring Phys: [id number] RONDELL A SMITH IMPRESSIONS  1. Left ventricular ejection fraction, by estimation, is 40 to 45%. The left ventricle has mildly decreased function. The left  ventricle demonstrates regional wall motion abnormalities (see scoring diagram/findings for description). There is moderate asymmetric left ventricular hypertrophy of the basal-septal segment. Left ventricular diastolic function could not be evaluated. There is severe hypokinesis of the left ventricular, entire septal wall, apical segment and inferoseptal wall.  2. Right ventricular systolic function is mildly reduced. The right ventricular size is normal.  3. Left atrial size was severely dilated.  4. The mitral valve is abnormal. Mild mitral valve regurgitation.  5. The aortic valve is tricuspid. Aortic valve regurgitation is mild. Mild aortic valve sclerosis is present, with no evidence of aortic valve stenosis. FINDINGS  Left Ventricle: Left ventricular ejection fraction, by estimation, is 40 to 45%. The left ventricle has mildly decreased function. The left ventricle demonstrates regional wall motion abnormalities. Severe hypokinesis of the left ventricular, entire septal wall, apical segment and inferoseptal wall. The left ventricular internal cavity size was normal in size. There is moderate asymmetric left ventricular hypertrophy of the basal-septal segment. Left ventricular diastolic function could not be evaluated due to atrial fibrillation. Left ventricular diastolic function could not be evaluated. Right Ventricle: The right ventricular size is normal. No increase in right ventricular wall thickness. Right ventricular systolic function is mildly reduced. Left Atrium: Left atrial size was severely dilated. Right Atrium: Right atrial size was normal in size. Pericardium: There is no evidence of pericardial effusion. Mitral Valve: The mitral valve is abnormal. There is mild thickening of the mitral valve leaflet(s). Mild mitral annular calcification. Mild mitral valve regurgitation. Tricuspid Valve: The tricuspid valve is grossly normal. Tricuspid valve regurgitation is trivial. Aortic Valve: The aortic  valve is tricuspid. Aortic valve regurgitation is mild. Aortic regurgitation PHT measures 456 msec. Mild aortic valve sclerosis is present, with no evidence of aortic valve stenosis. Pulmonic Valve: The pulmonic valve was not well visualized. Pulmonic valve regurgitation is not visualized. Aorta: The aortic root and ascending aorta are structurally normal, with no evidence of dilitation. IAS/Shunts: No atrial level shunt detected by color flow Doppler.  LEFT VENTRICLE PLAX 2D LVIDd:         5.10 cm LVIDs:         3.60 cm LV PW:         1.20 cm LV IVS:        1.20 cm LVOT diam:     2.20 cm LV SV:         45 LV SV Index:   25 LVOT Area:  3.80 cm  LV Volumes (MOD) LV vol d, MOD A2C: 69.8 ml LV vol d, MOD A4C: 78.7 ml LV vol s, MOD A2C: 43.4 ml LV vol s, MOD A4C: 46.0 ml LV SV MOD A2C:     26.4 ml LV SV MOD A4C:     78.7 ml LV SV MOD BP:      31.6 ml RIGHT VENTRICLE TAPSE (M-mode): 1.0 cm LEFT ATRIUM              Index       RIGHT ATRIUM           Index LA diam:        4.80 cm  2.62 cm/m  RA Area:     19.50 cm LA Vol (A2C):   117.0 ml 63.95 ml/m RA Volume:   49.00 ml  26.78 ml/m LA Vol (A4C):   83.9 ml  45.86 ml/m LA Biplane Vol: 98.5 ml  53.84 ml/m  AORTIC VALVE LVOT Vmax:   72.90 cm/s LVOT Vmean:  51.000 cm/s LVOT VTI:    0.118 m AI PHT:      456 msec  AORTA Ao Root diam: 3.10 cm Ao Asc diam:  3.00 cm MR Peak grad:    109.4 mmHg  TRICUSPID VALVE MR Mean grad:    68.0 mmHg   TR Peak grad:   31.1 mmHg MR Vmax:         523.00 cm/s TR Vmax:        279.00 cm/s MR Vmean:        388.0 cm/s MR PISA:         0.57 cm    SHUNTS MR PISA Eff ROA: 4 mm       Systemic VTI:  0.12 m MR PISA Radius:  0.30 cm     Systemic Diam: 2.20 cm Lindsey Shutter MD Electronically signed by Lindsey Shutter MD Signature Date/Time: 11/23/2019/4:42:56 PM    Final    Assessment and Plan:   Acute on chronic diastolic CHF - Presented with sob. bNP up to 600, Hs trop mildly elevated. CXR unremarkable. D-dimer up. CT chest showed mild  interstitial edema with small b/l pleural effusions and mild cardiomegaly, bronchitis, - Echo showed EF 40-45%, mod LVH, severe hypokinesis of KV, entire septal wall, apical segment and inferoseptal wall, mildly reduced RV function, severely dilated LA, mild MR, mld AR - IV lasix 40 mg BID - pta lasix 40 mg daily - I/Os do not appear complete. Weights down 1 lbs.  - Given low EF can start ACE/ARB. Creatinine stable - continue with diuresis.   CM -Unsure if CM ischemic or nonischemic. Patient reports prior coronary stenting. EKG has septal q waves. Could also be from poor rate control - EF 40-45% - pt did report intermittent sharp chest pain and had mildly elevate troponin - continue BB - Do not suspect we will pursue ischemic eval   SIRS - tachycardic and tachypneic on exam with mildly elevated WBC and temp 100.4 - possible bronchitis on CXR - LA normal - CRP and D-dimer mildly elevated - BC checked - Respiratory panel - IV abx per IM  Paroxysmal Afib - rates mildly elevated on admission - Xarelto for stroke ppx - TSH normal - continue metoprolol for rate control. Can increase for better rate control however likely driven by CHF  Elevated troponin - trend is flat - CT chest noted coronary atery and aortic calcified atherosclerosis.  - Patient reported remote stenting - continue aspirin  HLD - pta atorvastatin 10 mg daily - check FLP tomorrow  HTN - pta Toprol-XL 100 mg daily - pressures mildly elevated  DM2 - SSI per IM  For questions or updates, please contact [facility] Please consult [email or web address] for contact info under    Signed, Quincee Gittens David Stall, PA-C  11/24/2019 12:37 PM

## 2019-11-25 DIAGNOSIS — J441 Chronic obstructive pulmonary disease with (acute) exacerbation: Secondary | ICD-10-CM

## 2019-11-25 LAB — BASIC METABOLIC PANEL
Anion gap: 10 (ref 5–15)
BUN: 21 mg/dL (ref 8–23)
CO2: 23 mmol/L (ref 22–32)
Calcium: 9.4 mg/dL (ref 8.9–10.3)
Chloride: 104 mmol/L (ref 98–111)
Creatinine, Ser: 0.9 mg/dL (ref 0.44–1.00)
GFR calc Af Amer: >60 mL/min (ref >60)
GFR calc non Af Amer: 57 mL/min — ABNORMAL LOW (ref >60)
Glucose, Bld: 284 mg/dL — ABNORMAL HIGH (ref 70–99)
Potassium: 4.2 mmol/L (ref 3.5–5.1)
Sodium: 137 mmol/L (ref 135–145)

## 2019-11-25 LAB — LIPID PANEL
Cholesterol: 137 mg/dL (ref 0–200)
HDL: 39 mg/dL — ABNORMAL LOW (ref >40)
LDL Cholesterol: 83 mg/dL (ref 0–99)
Total CHOL/HDL Ratio: 3.5 RATIO
Triglycerides: 74 mg/dL (ref <150)
VLDL: 15 mg/dL (ref 0–40)

## 2019-11-25 LAB — GLUCOSE, CAPILLARY
Glucose-Capillary: 278 mg/dL — ABNORMAL HIGH (ref 70–99)
Glucose-Capillary: 295 mg/dL — ABNORMAL HIGH (ref 70–99)
Glucose-Capillary: 320 mg/dL — ABNORMAL HIGH (ref 70–99)
Glucose-Capillary: 255 mg/dL — ABNORMAL HIGH (ref 70–99)

## 2019-11-25 MED ORDER — RIVAROXABAN 15 MG PO TABS
15.0000 mg | ORAL_TABLET | Freq: Every day | ORAL | Status: DC
  Administered 2019-11-26 – 2019-11-27 (×2): 15 mg via ORAL
  Filled 2019-11-25 (×2): qty 1

## 2019-11-25 MED ORDER — FUROSEMIDE 10 MG/ML IJ SOLN
80.0000 mg | Freq: Two times a day (BID) | INTRAMUSCULAR | Status: DC
  Filled 2019-11-25: qty 8

## 2019-11-25 MED ORDER — ALBUTEROL SULFATE (2.5 MG/3ML) 0.083% IN NEBU
2.5000 mg | INHALATION_SOLUTION | Freq: Two times a day (BID) | RESPIRATORY_TRACT | Status: DC
  Administered 2019-11-26 – 2019-11-27 (×2): 2.5 mg via RESPIRATORY_TRACT
  Filled 2019-11-25 (×4): qty 3

## 2019-11-25 MED ORDER — POTASSIUM CHLORIDE CRYS ER 20 MEQ PO TBCR
40.0000 meq | EXTENDED_RELEASE_TABLET | Freq: Two times a day (BID) | ORAL | Status: AC
  Administered 2019-11-25 (×2): 40 meq via ORAL
  Filled 2019-11-25 (×2): qty 2

## 2019-11-25 NOTE — Progress Notes (Signed)
PROGRESS NOTE    Lindsey [name]   WUJ:[id number]  DOB: October 15, 1930  DOA: 11/22/2019 PCP: Patient, No Pcp Per   Brief Narrative:  Lindsey [name] is an 86 y.o. female past medical history significant for essential hypertension, paroxysmal atrial fibrillation on Eliquis, diabetes mellitus type 2 comes in complaining of shortness of breath that started 1 week prior to admission.  She was recently seen in the ED on 11/15/2019 with reports of not feeling well was found to have a UTI started on antibiotics.  In the ED she was noted to have a temperature of 100.4, heart rate of 111, respiratory rate in the high 20s with a slightly elevated WBC count of 11.3. Chest x-ray was unrevealing Covid PCR was negative   Subjective: Felt extremely weak today.  Was barely able to walk earlier today.  She continues to have some shortness of breath and a mild cough.    Assessment & Plan:   Principal Problem: Acute COPD exacerbation with acute bronchitis -She was started on IV steroids and IV antibiotics (doxycycline) yesterday and appears to have slightly improved.   - She was quite weak today and still very short of breath 87% when walking and therefore is not quite ready to go home yet.   Active Problems:   Chest pain   Acute CHF (congestive heart failure)  -He has received IV diuretics and this has improved-IV diuretics are now being held -Regards to the chest pain, it may have been related to her dyspnea-troponin were only mildly elevated at 41 and 45    SIRS (systemic inflammatory response syndrome)  Low-grade fever, tachycardia and tachypnea -Question if related to acute bronchitis - as mentioned above, doxycycline is being given    Unspecified atrial fibrillation -  Chronic anticoagulation -Continue metoprolol and Xarelto  UTI? -UA  on 8/30 was negative    Time spent in minutes: 35 DVT prophylaxis: Xarelto Code Status: DO NOT RESUSCITATE Family Communication:  Disposition Plan:   Status is: Inpatient  Remains inpatient appropriate because:Acutely hypoxic secondary to COPD and acute bronchitis   Dispo: The patient is from: Home              Anticipated d/c is to: Home              Anticipated d/c date is: 2 days              Patient currently is not medically stable to d/c.      Consultants:   Cardiology Procedures:   Waiting 2D echo Antimicrobials:  Anti-infectives (From admission, onward)   Start     Dose/Rate Route Frequency Ordered Stop   11/24/19 0900  doxycycline (VIBRAMYCIN) 100 mg in sodium chloride 0.9 % 250 mL IVPB        100 mg 125 mL/hr over 120 Minutes Intravenous Every 12 hours 11/24/19 0803         Objective: Vitals:   11/24/19 1916 11/24/19 1927 11/25/19 0419 11/25/19 0928  BP: 118/61  137/89   Pulse: 86  99   Resp: 18  18   Temp: 97.9 F (36.6 C)  (!) 97.3 F (36.3 C)   TempSrc: Oral  Oral   SpO2: 94% 100% 99% 96%  Weight:   70.1 kg   Height:        Intake/Output Summary (Last 24 hours) at 11/25/2019 1420 Last data filed at 11/25/2019 0639 Gross per 24 hour  Intake 620.53 ml  Output 300 ml  Net 320.53 ml  Filed Weights   11/23/19 1716 11/24/19 0200 11/25/19 0419  Weight: 70.9 kg 70.7 kg 70.1 kg    Examination: General exam: Appears comfortable  HEENT: PERRLA, oral mucosa moist, no sclera icterus or thrush Respiratory system: Clear to auscultation. Respiratory effort normal. Cardiovascular system: S1 & S2 heard, RRR.   Gastrointestinal system: Abdomen soft, non-tender, nondistended. Normal bowel sounds. Central nervous system: Alert and oriented. No focal neurological deficits. Extremities: No cyanosis, clubbing or edema Skin: No rashes or ulcers Psychiatry:  Mood & affect appropriate.     Data Reviewed: I have personally reviewed following labs and imaging studies  CBC: Recent Labs  Lab 11/22/19 1814 11/24/19 0702  WBC 11.3* 10.4  HGB 11.1* 11.4*  HCT 35.2* 35.6*  MCV 88.9 89.0  PLT 369 326    Basic Metabolic Panel: Recent Labs  Lab 11/22/19 1814 11/24/19 0702 11/25/19 0545  NA 137 137 137  K 3.8 3.8 4.2  CL 103 101 104  CO2 24 25 23   GLUCOSE 180* 187* 284*  BUN 14 13 21   CREATININE 0.69 0.77 0.90  CALCIUM 9.2 9.2 9.4   GFR: Estimated Creatinine Clearance: 42.8 mL/min (by C-G formula based on SCr of 0.9 mg/dL). Liver Function Tests: No results for input(s): AST, ALT, ALKPHOS, BILITOT, PROT, ALBUMIN in the last 168 hours. No results for input(s): LIPASE, AMYLASE in the last 168 hours. No results for input(s): AMMONIA in the last 168 hours. Coagulation Profile: No results for input(s): INR, PROTIME in the last 168 hours. Cardiac Enzymes: No results for input(s): CKTOTAL, CKMB, CKMBINDEX, TROPONINI in the last 168 hours. BNP (last 3 results) No results for input(s): PROBNP in the last 8760 hours. HbA1C: Recent Labs    11/23/19 1850  HGBA1C 6.9*   CBG: Recent Labs  Lab 11/24/19 1116 11/24/19 1706 11/24/19 2124 11/25/19 0602 11/25/19 1142  GLUCAP 164* 284* 251* 278* 295*   Lipid Profile: Recent Labs    11/25/19 0545  CHOL 137  HDL 39*  LDLCALC 83  TRIG 74  CHOLHDL 3.5   Thyroid Function Tests: Recent Labs    11/23/19 1850  TSH 0.991   Anemia Panel: Recent Labs    11/23/19 0518  FERRITIN 65   Urine analysis:    Component Value Date/Time   COLORURINE YELLOW 11/23/2019 0630   APPEARANCEUR HAZY (A) 11/23/2019 0630   LABSPEC 1.014 11/23/2019 0630   PHURINE 5.0 11/23/2019 0630   GLUCOSEU NEGATIVE 11/23/2019 0630   HGBUR NEGATIVE 11/23/2019 0630   BILIRUBINUR NEGATIVE 11/23/2019 0630   KETONESUR NEGATIVE 11/23/2019 0630   PROTEINUR NEGATIVE 11/23/2019 0630   NITRITE NEGATIVE 11/23/2019 0630   LEUKOCYTESUR NEGATIVE 11/23/2019 0630   Sepsis Labs: @LABRCNTIP (procalcitonin:4,lacticidven:4) ) Recent Results (from the past 240 hour(s))  SARS Coronavirus 2 by RT PCR (hospital order, [facility]) Nasopharyngeal  Nasopharyngeal Swab     Status: None   Collection Time: 11/15/19  3:36 PM   Specimen: Nasopharyngeal Swab  Result Value Ref Range Status   SARS Coronavirus 2 NEGATIVE NEGATIVE Final    Comment: (NOTE) SARS-CoV-2 target nucleic acids are NOT DETECTED.  The SARS-CoV-2 RNA is generally detectable in upper and lower respiratory specimens during the acute phase of infection. The lowest concentration of SARS-CoV-2 viral copies this assay can detect is 250 copies / mL. A negative result does not preclude SARS-CoV-2 infection and should not be used as the sole basis for treatment or other patient management decisions.  A negative result may occur with improper  specimen collection / handling, submission of specimen other than nasopharyngeal swab, presence of viral mutation(s) within the areas targeted by this assay, and inadequate number of viral copies (<250 copies / mL). A negative result must be combined with clinical observations, patient history, and epidemiological information.  Fact Sheet for Patients:   BoilerBrush.com.cy  Fact Sheet for Healthcare Providers: https://pope.com/  This test is not yet approved or  cleared by the [place] FDA and has been authorized for detection and/or diagnosis of SARS-CoV-2 by FDA under an Emergency Use Authorization (EUA).  This EUA will remain in effect (meaning this test can be used) for the duration of the COVID-19 declaration under Section 564(b)(1) of the Act, 21 U.S.C. section 360bbb-3(b)(1), unless the authorization is terminated or revoked sooner.  Performed at [facility], [address], [place], [place] [address]   Urine Culture     Status: Abnormal   Collection Time: 11/15/19  6:09 PM   Specimen: Urine, Random  Result Value Ref Range Status   Specimen Description   Final    URINE, RANDOM Performed at [facility], [address]  [place], [place] [address]    Special Requests   Final    NONE Performed at [facility], [address], [place], [place] [address]    Culture MULTIPLE SPECIES PRESENT, SUGGEST RECOLLECTION (A)  Final   Report Status 11/16/2019 FINAL  Final  SARS Coronavirus 2 by RT PCR (hospital order, performed in [facility] hospital lab) Nasopharyngeal Nasopharyngeal Swab     Status: None   Collection Time: 11/23/19  5:18 AM   Specimen: Nasopharyngeal Swab  Result Value Ref Range Status   SARS Coronavirus 2 NEGATIVE NEGATIVE Final    Comment: (NOTE) SARS-CoV-2 target nucleic acids are NOT DETECTED.  The SARS-CoV-2 RNA is generally detectable in upper and lower respiratory specimens during the acute phase of infection. The lowest concentration of SARS-CoV-2 viral copies this assay can detect is 250 copies / mL. A negative result does not preclude SARS-CoV-2 infection and should not be used as the sole basis for treatment or other patient management decisions.  A negative result may occur with improper specimen collection / handling, submission of specimen other than nasopharyngeal swab, presence of viral mutation(s) within the areas targeted by this assay, and inadequate number of viral copies (<250 copies / mL). A negative result must be combined with clinical observations, patient history, and epidemiological information.  Fact Sheet for Patients:   BoilerBrush.com.cy  Fact Sheet for Healthcare Providers: https://pope.com/  This test is not yet approved or  cleared by the [place] FDA and has been authorized for detection and/or diagnosis of SARS-CoV-2 by FDA under an Emergency Use Authorization (EUA).  This EUA will remain in effect (meaning this test can be used) for the duration of the COVID-19 declaration under Section 564(b)(1) of the Act, 21 U.S.C. section 360bbb-3(b)(1), unless the authorization is terminated  or revoked sooner.  Performed at [facility], [address], [place], [place] [address]   Culture, blood (routine x 2)     Status: None (Preliminary result)   Collection Time: 11/23/19  7:50 PM   Specimen: BLOOD LEFT HAND  Result Value Ref Range Status   Specimen Description BLOOD LEFT HAND  Final   Special Requests   Final    BOTTLES DRAWN AEROBIC ONLY Blood Culture adequate volume   Culture   Final    NO GROWTH 2 DAYS Performed at  [facility], [address] [place], [place] [address]    Report Status PENDING  Incomplete  Culture, blood (routine x 2)     Status: None (Preliminary result)   Collection Time: 11/23/19  7:51 PM   Specimen: BLOOD RIGHT ARM  Result Value Ref Range Status   Specimen Description BLOOD RIGHT ARM  Final   Special Requests   Final    BOTTLES DRAWN AEROBIC ONLY Blood Culture adequate volume   Culture   Final    NO GROWTH 2 DAYS Performed at [facility], [address], [place], [place] [address]    Report Status PENDING  Incomplete         Radiology Studies: CT CHEST WO CONTRAST  Result Date: 11/24/2019 CLINICAL DATA:  Chronic shortness of breath. EXAM: CT CHEST WITHOUT CONTRAST TECHNIQUE: Multidetector CT imaging of the chest was performed following the standard protocol without IV contrast. COMPARISON:  Chest radiograph 11/22/2019 FINDINGS: Cardiovascular: Mild cardiomegaly. No pericardial effusion. Coronary artery and aortic calcified atherosclerosis. Mediastinum/Nodes: No enlarged mediastinal or axillary lymph nodes. Thyroid gland, trachea, and esophagus demonstrate no significant findings. Lungs/Pleura: Diffuse bronchial wall thickening. Ground-glass opacities within the dependent lungs and lingula, compatible with atelectasis. Mild interlobular septal thickening, most conspicuous in the lung bases. Small bilateral pleural effusions. No pneumothorax Upper Abdomen: No acute abnormality. Calcifications along the left diaphragm.  Musculoskeletal: Multilevel degenerative changes of the spine. No evidence of acute osseous abnormality. IMPRESSION: 1. Mild interstitial edema with small bilateral pleural effusions and mild cardiomegaly. 2. Diffuse bronchial wall thickening, as can be seen with bronchitis. 3. Atelectasis without focal consolidation. Electronically Signed   By: Feliberto Harts MD   On: 11/24/2019 10:34   ECHOCARDIOGRAM COMPLETE  Result Date: 11/23/2019    ECHOCARDIOGRAM REPORT   Patient Name:   Lindsey [name] Date of Exam: 11/23/2019 Medical Rec #:  [id number]        Height:       67.0 in Accession #:    [id number]       Weight:       158.0 lb Date of Birth:  [birth year]        BSA:          1.829 m Patient Age:    89 years         BP:           155/73 mmHg Patient Gender: F                HR:           94 bpm. Exam Location:  Inpatient Procedure: 2D Echo, Cardiac Doppler and Color Doppler Indications:    Atrial Fibrillation 427.31 / I48.91  History:        Patient has no prior history of Echocardiogram examinations.                 Risk Factors:Hypertension.  Sonographer:    Eulah Pont RDCS Referring Phys: [id number] RONDELL A SMITH IMPRESSIONS  1. Left ventricular ejection fraction, by estimation, is 40 to 45%. The left ventricle has mildly decreased function. The left ventricle demonstrates regional wall motion abnormalities (see scoring diagram/findings for description). There is moderate asymmetric left ventricular hypertrophy of the basal-septal segment. Left ventricular diastolic function could not be evaluated. There is severe hypokinesis of the left ventricular, entire septal wall, apical segment and inferoseptal wall.  2. Right ventricular systolic function is mildly reduced. The right ventricular size is normal.  3. Left  atrial size was severely dilated.  4. The mitral valve is abnormal. Mild mitral valve regurgitation.  5. The aortic valve is tricuspid. Aortic valve regurgitation is mild. Mild aortic valve  sclerosis is present, with no evidence of aortic valve stenosis. FINDINGS  Left Ventricle: Left ventricular ejection fraction, by estimation, is 40 to 45%. The left ventricle has mildly decreased function. The left ventricle demonstrates regional wall motion abnormalities. Severe hypokinesis of the left ventricular, entire septal wall, apical segment and inferoseptal wall. The left ventricular internal cavity size was normal in size. There is moderate asymmetric left ventricular hypertrophy of the basal-septal segment. Left ventricular diastolic function could not be evaluated due to atrial fibrillation. Left ventricular diastolic function could not be evaluated. Right Ventricle: The right ventricular size is normal. No increase in right ventricular wall thickness. Right ventricular systolic function is mildly reduced. Left Atrium: Left atrial size was severely dilated. Right Atrium: Right atrial size was normal in size. Pericardium: There is no evidence of pericardial effusion. Mitral Valve: The mitral valve is abnormal. There is mild thickening of the mitral valve leaflet(s). Mild mitral annular calcification. Mild mitral valve regurgitation. Tricuspid Valve: The tricuspid valve is grossly normal. Tricuspid valve regurgitation is trivial. Aortic Valve: The aortic valve is tricuspid. Aortic valve regurgitation is mild. Aortic regurgitation PHT measures 456 msec. Mild aortic valve sclerosis is present, with no evidence of aortic valve stenosis. Pulmonic Valve: The pulmonic valve was not well visualized. Pulmonic valve regurgitation is not visualized. Aorta: The aortic root and ascending aorta are structurally normal, with no evidence of dilitation. IAS/Shunts: No atrial level shunt detected by color flow Doppler.  LEFT VENTRICLE PLAX 2D LVIDd:         5.10 cm LVIDs:         3.60 cm LV PW:         1.20 cm LV IVS:        1.20 cm LVOT diam:     2.20 cm LV SV:         45 LV SV Index:   25 LVOT Area:     3.80 cm  LV  Volumes (MOD) LV vol d, MOD A2C: 69.8 ml LV vol d, MOD A4C: 78.7 ml LV vol s, MOD A2C: 43.4 ml LV vol s, MOD A4C: 46.0 ml LV SV MOD A2C:     26.4 ml LV SV MOD A4C:     78.7 ml LV SV MOD BP:      31.6 ml RIGHT VENTRICLE TAPSE (M-mode): 1.0 cm LEFT ATRIUM              Index       RIGHT ATRIUM           Index LA diam:        4.80 cm  2.62 cm/m  RA Area:     19.50 cm LA Vol (A2C):   117.0 ml 63.95 ml/m RA Volume:   49.00 ml  26.78 ml/m LA Vol (A4C):   83.9 ml  45.86 ml/m LA Biplane Vol: 98.5 ml  53.84 ml/m  AORTIC VALVE LVOT Vmax:   72.90 cm/s LVOT Vmean:  51.000 cm/s LVOT VTI:    0.118 m AI PHT:      456 msec  AORTA Ao Root diam: 3.10 cm Ao Asc diam:  3.00 cm MR Peak grad:    109.4 mmHg  TRICUSPID VALVE MR Mean grad:    68.0 mmHg   TR Peak grad:   31.1 mmHg MR  Vmax:         523.00 cm/s TR Vmax:        279.00 cm/s MR Vmean:        388.0 cm/s MR PISA:         0.57 cm    SHUNTS MR PISA Eff ROA: 4 mm       Systemic VTI:  0.12 m MR PISA Radius:  0.30 cm     Systemic Diam: 2.20 cm Zoila Shutter MD Electronically signed by Zoila Shutter MD Signature Date/Time: 11/23/2019/4:42:56 PM    Final       Scheduled Meds: . albuterol  2.5 mg Nebulization TID  . aspirin  81 mg Oral Daily  . atorvastatin  10 mg Oral Daily  . docusate sodium  100 mg Oral QODAY  . DULoxetine  60 mg Oral Daily  . gabapentin  100 mg Oral TID  . insulin aspart  0-6 Units Subcutaneous TID WC  . losartan  25 mg Oral Daily  . meclizine  12.5 mg Oral Daily  . methylPREDNISolone (SOLU-MEDROL) injection  40 mg Intravenous Q12H  . metoprolol succinate  200 mg Oral Daily  . multivitamin with minerals  1 tablet Oral q morning - 10a  . pantoprazole  40 mg Oral q morning - 10a  . potassium chloride  40 mEq Oral BID  . rivaroxaban  20 mg Oral Daily  . sodium chloride flush  3 mL Intravenous Q12H  . tamsulosin  0.4 mg Oral q morning - 10a   Continuous Infusions: . doxycycline (VIBRAMYCIN) IV 100 mg (11/24/19 2130)     LOS: 2 days       Calvert Cantor, MD [facility] Pager: [email or web address] 11/25/2019, 2:20 PM

## 2019-11-25 NOTE — Progress Notes (Signed)
Inpatient Diabetes Program Recommendations  AACE/ADA: New Consensus Statement on Inpatient Glycemic Control (2015)  Target Ranges:  Prepandial:   less than 140 mg/dL      Peak postprandial:   less than 180 mg/dL (1-2 hours)      Critically ill patients:  140 - 180 mg/dL   Lab Results  Component Value Date   GLUCAP 295 (H) 11/25/2019   HGBA1C 6.9 (H) 11/23/2019    Review of Glycemic Control Results for LAURYL, SEYER (MRN [id number]) as of 11/25/2019 12:58  Ref. Range 11/24/2019 17:06 11/24/2019 21:24 11/25/2019 06:02 11/25/2019 11:42  Glucose-Capillary Latest Ref Range: 70 - 99 mg/dL 409 (H) 811 (H) 914 (H) 295 (H)   Diabetes history: Type 2 DM Outpatient Diabetes medications: Metformin 500 mg BID Current orders for Inpatient glycemic control: Novolog 0-6 units TID Solumedrol 40 mg BID  Inpatient Diabetes Program Recommendations:    If to remain inpatient, may want to consider increasing correction to Novolog 0-15 units TID.   Thanks, Lujean Rave, MSN, RNC-OB Diabetes Coordinator [phone] (8a-5p)

## 2019-11-25 NOTE — Progress Notes (Signed)
Cardiology Progress Note  Patient ID: Lindsey [name] MRN: [id number] DOB: [birth year] Date of Encounter: 11/25/2019  Primary Cardiologist: No primary care provider on file.  Subjective   Chief Complaint: Shortness of breath  HPI: Shortness of breath reported this morning.  Profoundly weak.  Orthostatics positive.  A. fib rates not that well controlled.  Just got her 200 mg of metoprolol succinate.  Denies chest pain.  ROS:  All other ROS reviewed and negative. Pertinent positives noted in the HPI.     Inpatient Medications  Scheduled Meds:  albuterol  2.5 mg Nebulization TID   aspirin  81 mg Oral Daily   atorvastatin  10 mg Oral Daily   docusate sodium  100 mg Oral QODAY   DULoxetine  60 mg Oral Daily   gabapentin  100 mg Oral TID   insulin aspart  0-6 Units Subcutaneous TID WC   losartan  25 mg Oral Daily   meclizine  12.5 mg Oral Daily   methylPREDNISolone (SOLU-MEDROL) injection  40 mg Intravenous Q12H   metoprolol succinate  200 mg Oral Daily   multivitamin with minerals  1 tablet Oral q morning - 10a   pantoprazole  40 mg Oral q morning - 10a   potassium chloride  40 mEq Oral BID   rivaroxaban  20 mg Oral Daily   sodium chloride flush  3 mL Intravenous Q12H   tamsulosin  0.4 mg Oral q morning - 10a   Continuous Infusions:  doxycycline (VIBRAMYCIN) IV 100 mg (11/24/19 2130)   PRN Meds: acetaminophen **OR** acetaminophen, albuterol, diclofenac Sodium, hydrALAZINE, meclizine, ondansetron **OR** ondansetron (ZOFRAN) IV, SUMAtriptan   Vital Signs   Vitals:   11/24/19 1916 11/24/19 1927 11/25/19 0419 11/25/19 0928  BP: 118/61  137/89   Pulse: 86  99   Resp: 18  18   Temp: 97.9 F (36.6 C)  (!) 97.3 F (36.3 C)   TempSrc: Oral  Oral   SpO2: 94% 100% 99% 96%  Weight:   70.1 kg   Height:        Intake/Output Summary (Last 24 hours) at 11/25/2019 1023 Last data filed at 11/25/2019 9528 Gross per 24 hour  Intake 860.53 ml  Output 800 ml  Net  60.53 ml   Last 3 Weights 11/25/2019 11/24/2019 11/23/2019  Weight (lbs) 154 lb 8.7 oz 155 lb 13.8 oz 156 lb 4.9 oz  Weight (kg) 70.1 kg 70.7 kg 70.9 kg      Telemetry  Overnight telemetry shows A. fib with RVR up to 110, which I personally reviewed.   ECG  The most recent ECG shows atrial fibrillation, heart rate 106, old anteroseptal infarct, LVH with repolarization abnormality noted, which I personally reviewed.   Physical Exam   Vitals:   11/24/19 1916 11/24/19 1927 11/25/19 0419 11/25/19 0928  BP: 118/61  137/89   Pulse: 86  99   Resp: 18  18   Temp: 97.9 F (36.6 C)  (!) 97.3 F (36.3 C)   TempSrc: Oral  Oral   SpO2: 94% 100% 99% 96%  Weight:   70.1 kg   Height:         Intake/Output Summary (Last 24 hours) at 11/25/2019 1023 Last data filed at 11/25/2019 0639 Gross per 24 hour  Intake 860.53 ml  Output 800 ml  Net 60.53 ml    Last 3 Weights 11/25/2019 11/24/2019 11/23/2019  Weight (lbs) 154 lb 8.7 oz 155 lb 13.8 oz 156 lb 4.9 oz  Weight (kg) 70.1 kg  70.7 kg 70.9 kg    Body mass index is 24.2 kg/m.  General: Well nourished, well developed, in no acute distress Head: Atraumatic, normal size  Eyes: PEERLA, EOMI  Neck: Supple, JVD around 7 cm of water Endocrine: No thryomegaly Cardiac: Normal S1, S2; irregular rhythm, no murmurs rubs or gallops Lungs: Faint crackles at the lung bases Abd: Soft, nontender, no hepatomegaly  Ext: No edema, pulses 2+ Musculoskeletal: No deformities, BUE and BLE strength normal and equal Skin: Warm and dry, no rashes   Neuro: Alert and oriented to person, place, time, and situation, CNII-XII grossly intact, no focal deficits  Psych: Normal mood and affect   Labs  High Sensitivity Troponin:   Recent Labs  Lab 11/22/19 1814 11/23/19 0210  TROPONINIHS 41* 45*     Cardiac EnzymesNo results for input(s): TROPONINI in the last 168 hours. No results for input(s): TROPIPOC in the last 168 hours.  Chemistry Recent Labs  Lab 11/22/19 1814  11/24/19 0702  NA 137 137  K 3.8 3.8  CL 103 101  CO2 24 25  GLUCOSE 180* 187*  BUN 14 13  CREATININE 0.69 0.77  CALCIUM 9.2 9.2  GFRNONAA >60 >60  GFRAA >60 >60  ANIONGAP 10 11    Hematology Recent Labs  Lab 11/22/19 1814 11/24/19 0702  WBC 11.3* 10.4  RBC 3.96 4.00  HGB 11.1* 11.4*  HCT 35.2* 35.6*  MCV 88.9 89.0  MCH 28.0 28.5  MCHC 31.5 32.0  RDW 14.0 13.8  PLT 369 326   BNP Recent Labs  Lab 11/23/19 0518  BNP 687.3*    DDimer  Recent Labs  Lab 11/23/19 0518  DDIMER 0.57*     Radiology  CT CHEST WO CONTRAST  Result Date: 11/24/2019 CLINICAL DATA:  Chronic shortness of breath. EXAM: CT CHEST WITHOUT CONTRAST TECHNIQUE: Multidetector CT imaging of the chest was performed following the standard protocol without IV contrast. COMPARISON:  Chest radiograph 11/22/2019 FINDINGS: Cardiovascular: Mild cardiomegaly. No pericardial effusion. Coronary artery and aortic calcified atherosclerosis. Mediastinum/Nodes: No enlarged mediastinal or axillary lymph nodes. Thyroid gland, trachea, and esophagus demonstrate no significant findings. Lungs/Pleura: Diffuse bronchial wall thickening. Ground-glass opacities within the dependent lungs and lingula, compatible with atelectasis. Mild interlobular septal thickening, most conspicuous in the lung bases. Small bilateral pleural effusions. No pneumothorax Upper Abdomen: No acute abnormality. Calcifications along the left diaphragm. Musculoskeletal: Multilevel degenerative changes of the spine. No evidence of acute osseous abnormality. IMPRESSION: 1. Mild interstitial edema with small bilateral pleural effusions and mild cardiomegaly. 2. Diffuse bronchial wall thickening, as can be seen with bronchitis. 3. Atelectasis without focal consolidation. Electronically Signed   By: Feliberto Harts MD   On: 11/24/2019 10:34   ECHOCARDIOGRAM COMPLETE  Result Date: 11/23/2019    ECHOCARDIOGRAM REPORT   Patient Name:   Lindsey [name] Date of  Exam: 11/23/2019 Medical Rec #:  [id number]        Height:       67.0 in Accession #:    [id number]       Weight:       158.0 lb Date of Birth:  07/07/1930        BSA:          1.829 m Patient Age:    86 years         BP:           155/73 mmHg Patient Gender: F  HR:           94 bpm. Exam Location:  Inpatient Procedure: 2D Echo, Cardiac Doppler and Color Doppler Indications:    Atrial Fibrillation 427.31 / I48.91  History:        Patient has no prior history of Echocardiogram examinations.                 Risk Factors:Hypertension.  Sonographer:    Eulah Pont RDCS Referring Phys: [id number] RONDELL A SMITH IMPRESSIONS  1. Left ventricular ejection fraction, by estimation, is 40 to 45%. The left ventricle has mildly decreased function. The left ventricle demonstrates regional wall motion abnormalities (see scoring diagram/findings for description). There is moderate asymmetric left ventricular hypertrophy of the basal-septal segment. Left ventricular diastolic function could not be evaluated. There is severe hypokinesis of the left ventricular, entire septal wall, apical segment and inferoseptal wall.  2. Right ventricular systolic function is mildly reduced. The right ventricular size is normal.  3. Left atrial size was severely dilated.  4. The mitral valve is abnormal. Mild mitral valve regurgitation.  5. The aortic valve is tricuspid. Aortic valve regurgitation is mild. Mild aortic valve sclerosis is present, with no evidence of aortic valve stenosis. FINDINGS  Left Ventricle: Left ventricular ejection fraction, by estimation, is 40 to 45%. The left ventricle has mildly decreased function. The left ventricle demonstrates regional wall motion abnormalities. Severe hypokinesis of the left ventricular, entire septal wall, apical segment and inferoseptal wall. The left ventricular internal cavity size was normal in size. There is moderate asymmetric left ventricular hypertrophy of the basal-septal  segment. Left ventricular diastolic function could not be evaluated due to atrial fibrillation. Left ventricular diastolic function could not be evaluated. Right Ventricle: The right ventricular size is normal. No increase in right ventricular wall thickness. Right ventricular systolic function is mildly reduced. Left Atrium: Left atrial size was severely dilated. Right Atrium: Right atrial size was normal in size. Pericardium: There is no evidence of pericardial effusion. Mitral Valve: The mitral valve is abnormal. There is mild thickening of the mitral valve leaflet(s). Mild mitral annular calcification. Mild mitral valve regurgitation. Tricuspid Valve: The tricuspid valve is grossly normal. Tricuspid valve regurgitation is trivial. Aortic Valve: The aortic valve is tricuspid. Aortic valve regurgitation is mild. Aortic regurgitation PHT measures 456 msec. Mild aortic valve sclerosis is present, with no evidence of aortic valve stenosis. Pulmonic Valve: The pulmonic valve was not well visualized. Pulmonic valve regurgitation is not visualized. Aorta: The aortic root and ascending aorta are structurally normal, with no evidence of dilitation. IAS/Shunts: No atrial level shunt detected by color flow Doppler.  LEFT VENTRICLE PLAX 2D LVIDd:         5.10 cm LVIDs:         3.60 cm LV PW:         1.20 cm LV IVS:        1.20 cm LVOT diam:     2.20 cm LV SV:         45 LV SV Index:   25 LVOT Area:     3.80 cm  LV Volumes (MOD) LV vol d, MOD A2C: 69.8 ml LV vol d, MOD A4C: 78.7 ml LV vol s, MOD A2C: 43.4 ml LV vol s, MOD A4C: 46.0 ml LV SV MOD A2C:     26.4 ml LV SV MOD A4C:     78.7 ml LV SV MOD BP:      31.6 ml RIGHT VENTRICLE TAPSE (M-mode):  1.0 cm LEFT ATRIUM              Index       RIGHT ATRIUM           Index LA diam:        4.80 cm  2.62 cm/m  RA Area:     19.50 cm LA Vol (A2C):   117.0 ml 63.95 ml/m RA Volume:   49.00 ml  26.78 ml/m LA Vol (A4C):   83.9 ml  45.86 ml/m LA Biplane Vol: 98.5 ml  53.84 ml/m   AORTIC VALVE LVOT Vmax:   72.90 cm/s LVOT Vmean:  51.000 cm/s LVOT VTI:    0.118 m AI PHT:      456 msec  AORTA Ao Root diam: 3.10 cm Ao Asc diam:  3.00 cm MR Peak grad:    109.4 mmHg  TRICUSPID VALVE MR Mean grad:    68.0 mmHg   TR Peak grad:   31.1 mmHg MR Vmax:         523.00 cm/s TR Vmax:        279.00 cm/s MR Vmean:        388.0 cm/s MR PISA:         0.57 cm    SHUNTS MR PISA Eff ROA: 4 mm       Systemic VTI:  0.12 m MR PISA Radius:  0.30 cm     Systemic Diam: 2.20 cm Lindsey Shutter MD Electronically signed by Lindsey Shutter MD Signature Date/Time: 11/23/2019/4:42:56 PM    Final     Cardiac Studies  TTE 11/23/2019 1. Left ventricular ejection fraction, by estimation, is 40 to 45%. The  left ventricle has mildly decreased function. The left ventricle  demonstrates regional wall motion abnormalities (see scoring  diagram/findings for description). There is moderate  asymmetric left ventricular hypertrophy of the basal-septal segment. Left  ventricular diastolic function could not be evaluated. There is severe  hypokinesis of the left ventricular, entire septal wall, apical segment  and inferoseptal wall.  2. Right ventricular systolic function is mildly reduced. The right  ventricular size is normal.  3. Left atrial size was severely dilated.  4. The mitral valve is abnormal. Mild mitral valve regurgitation.  5. The aortic valve is tricuspid. Aortic valve regurgitation is mild.  Mild aortic valve sclerosis is present, with no evidence of aortic valve  stenosis.   Patient Profile  Lindsey [name] is a 86 y.o. female with hypertension, paroxysmal atrial fibrillation on Xarelto, diabetes who was admitted on 11/22/2019 for acute decompensated systolic heart failure complicated by atrial fibrillation with RVR.  Assessment & Plan   1.  Acute systolic heart failure, EF 40-45% -Was admitted with volume overload, elevated BNP and pleural effusions.  Has undergone diuresis. -She does appear  to be approaching euvolemia to me.  Would hold on further IV diuresis. -She was possibly orthostatic.  Would encourage good p.o. intake. -We did start losartan 25 mg daily.  We did increase her metoprolol succinate 200 mg daily for better rate control.  We will see how she does with the 200.  May need some diltiazem.  EF is greater than 40. -Overall I suspect her LV dysfunction is related to her A. fib.  She describes no chest pain.  EKG does show old anterior infarct. -She is DNR/DNI.  She is expressed a preference for medical management.  I do agree with this. -We will try to get her rate under better control and see how  she does today.  2.  Atrial fibrillation with RVR -Rate is up to 90-110 bpm.  Likely contributing to cardiomyopathy.  Rate control strategy recommended. -We will see how she does metoprolol succinate 20 mg daily.  Continue Xarelto.  For questions or updates, please contact [facility] Please consult [email or web address] for contact info under   Time Spent with Patient: I have spent a total of 25 minutes with patient reviewing hospital notes, telemetry, EKGs, labs and examining the patient as well as establishing an assessment and plan that was discussed with the patient.  > 50% of time was spent in direct patient care.    Signed, Lenna GilfordWesley T. Flora Lipps'Neal, MD [facility]   [facility]  11/25/2019 10:23 AM

## 2019-11-25 NOTE — Progress Notes (Signed)
  Mobility Specialist Criteria Algorithm Info.  SATURATION QUALIFICATIONS: (This note is used to comply with regulatory documentation for home oxygen)  Patient Saturations on Room Air at Rest = 97%  Patient Saturations on Room Air while Ambulating = 87%  Patient Saturations on 2 Liters of oxygen while Ambulating = 98%  Please briefly explain why patient needs home oxygen:  Mobility Team:  [facility] elevated:Self regulated Activity: Ambulated in room;Transferred:  Bed to chair Range of motion: Active;All extremities Level of assistance: Minimal assist, patient does 75% or more Assistive device: Front wheel walker Minutes sitting in chair:  Minutes stood: 3 minutes Minutes ambulated: 1 minutes Distance ambulated (ft): 5 ft Mobility response: Tolerated poorly;RN notified (c/o LE weakness "feels like my legs are going to giveout") Bed Position: Chair  Pt sitting EOB on entry, willing to participate in mobility this morning. Required minimal assistance sit/stand with RW and cues for hand placement. Initially upon standing, pt was very unsteady and could barely stand for 30 seconds before complaining of LE'S feeling weak and requiring seated rest. BP dropped from sit to stand. Was 140/70 while sitting and 125/61 while standing. Oxygen saturation also dropped to mid 80's on RA while standing. Denied feeling dizzy or lightheaded.   11/25/2019 3:14 PM

## 2019-11-26 ENCOUNTER — Inpatient Hospital Stay (HOSPITAL_COMMUNITY): Payer: Medicare Other

## 2019-11-26 ENCOUNTER — Encounter (HOSPITAL_COMMUNITY): Payer: Self-pay | Admitting: Certified Registered"

## 2019-11-26 DIAGNOSIS — A419 Sepsis, unspecified organism: Principal | ICD-10-CM

## 2019-11-26 LAB — BASIC METABOLIC PANEL
Anion gap: 11 (ref 5–15)
BUN: 37 mg/dL — ABNORMAL HIGH (ref 8–23)
CO2: 21 mmol/L — ABNORMAL LOW (ref 22–32)
Calcium: 9.4 mg/dL (ref 8.9–10.3)
Chloride: 105 mmol/L (ref 98–111)
Creatinine, Ser: 1.39 mg/dL — ABNORMAL HIGH (ref 0.44–1.00)
GFR calc Af Amer: 39 mL/min — ABNORMAL LOW (ref >60)
GFR calc non Af Amer: 34 mL/min — ABNORMAL LOW (ref >60)
Glucose, Bld: 270 mg/dL — ABNORMAL HIGH (ref 70–99)
Potassium: 4.4 mmol/L (ref 3.5–5.1)
Sodium: 137 mmol/L (ref 135–145)

## 2019-11-26 LAB — TROPONIN I (HIGH SENSITIVITY)
Troponin I (High Sensitivity): 16 ng/L (ref <18)
Troponin I (High Sensitivity): 36 ng/L — ABNORMAL HIGH (ref <18)

## 2019-11-26 LAB — GLUCOSE, CAPILLARY
Glucose-Capillary: 323 mg/dL — ABNORMAL HIGH (ref 70–99)
Glucose-Capillary: 234 mg/dL — ABNORMAL HIGH (ref 70–99)
Glucose-Capillary: 225 mg/dL — ABNORMAL HIGH (ref 70–99)
Glucose-Capillary: 209 mg/dL — ABNORMAL HIGH (ref 70–99)

## 2019-11-26 MED ORDER — AMIODARONE LOAD VIA INFUSION
150.0000 mg | Freq: Once | INTRAVENOUS | Status: AC
  Administered 2019-11-26: 150 mg via INTRAVENOUS
  Filled 2019-11-26: qty 83.34

## 2019-11-26 MED ORDER — AMIODARONE HCL IN DEXTROSE 360-4.14 MG/200ML-% IV SOLN: 20.0000 mg/h | INTRAVENOUS | Status: DC

## 2019-11-26 MED ORDER — HALOPERIDOL LACTATE 5 MG/ML IJ SOLN
2.0000 mg | Freq: Once | INTRAMUSCULAR | Status: AC
  Administered 2019-11-27: 2 mg via INTRAVENOUS
  Filled 2019-11-26: qty 1

## 2019-11-26 MED ORDER — FUROSEMIDE 10 MG/ML IJ SOLN
80.0000 mg | Freq: Once | INTRAMUSCULAR | Status: AC
  Administered 2019-11-26: 80 mg via INTRAVENOUS
  Filled 2019-11-26: qty 8

## 2019-11-26 MED ORDER — METHYLPREDNISOLONE SODIUM SUCC 40 MG IJ SOLR
40.0000 mg | INTRAMUSCULAR | Status: DC
  Administered 2019-11-27: 40 mg via INTRAVENOUS
  Filled 2019-11-26: qty 1

## 2019-11-26 MED ORDER — DILTIAZEM HCL 30 MG PO TABS
30.0000 mg | ORAL_TABLET | Freq: Four times a day (QID) | ORAL | Status: DC
  Administered 2019-11-26: 30 mg via ORAL
  Filled 2019-11-26: qty 1

## 2019-11-26 MED ORDER — DILTIAZEM HCL 30 MG PO TABS
30.0000 mg | ORAL_TABLET | Freq: Once | ORAL | Status: AC
  Administered 2019-11-26: 30 mg via ORAL
  Filled 2019-11-26: qty 1

## 2019-11-26 MED ORDER — INSULIN GLARGINE 100 UNIT/ML ~~LOC~~ SOLN
10.0000 [IU] | Freq: Every day | SUBCUTANEOUS | Status: DC
  Administered 2019-11-26 – 2019-11-27 (×2): 10 [IU] via SUBCUTANEOUS
  Filled 2019-11-26 (×2): qty 0.1

## 2019-11-26 MED ORDER — POTASSIUM CHLORIDE CRYS ER 20 MEQ PO TBCR
40.0000 meq | EXTENDED_RELEASE_TABLET | Freq: Two times a day (BID) | ORAL | Status: AC
  Administered 2019-11-26 (×2): 40 meq via ORAL
  Filled 2019-11-26 (×2): qty 2

## 2019-11-26 MED ORDER — IPRATROPIUM-ALBUTEROL 0.5-2.5 (3) MG/3ML IN SOLN
RESPIRATORY_TRACT | Status: AC
  Administered 2019-11-26: 3 mL
  Filled 2019-11-26: qty 3

## 2019-11-26 MED ORDER — AMIODARONE LOAD VIA INFUSION: 150.0000 mg | Freq: Once | INTRAVENOUS | Status: DC

## 2019-11-26 MED ORDER — IPRATROPIUM-ALBUTEROL 0.5-2.5 (3) MG/3ML IN SOLN: 3.0000 mL | Freq: Four times a day (QID) | RESPIRATORY_TRACT | Status: DC | PRN

## 2019-11-26 MED ORDER — DILTIAZEM HCL 60 MG PO TABS
60.0000 mg | ORAL_TABLET | Freq: Four times a day (QID) | ORAL | Status: DC
  Administered 2019-11-26 (×2): 60 mg via ORAL
  Filled 2019-11-26 (×2): qty 1

## 2019-11-26 MED ORDER — AMIODARONE HCL IN DEXTROSE 360-4.14 MG/200ML-% IV SOLN: 30.0000 mg/h | INTRAVENOUS | Status: DC

## 2019-11-26 MED ORDER — AMIODARONE HCL IN DEXTROSE 360-4.14 MG/200ML-% IV SOLN
60.0000 mg/h | INTRAVENOUS | Status: AC
  Administered 2019-11-26: 60 mg/h via INTRAVENOUS
  Filled 2019-11-26 (×2): qty 200

## 2019-11-26 MED ORDER — METOPROLOL TARTRATE 5 MG/5ML IV SOLN
5.0000 mg | Freq: Once | INTRAVENOUS | Status: AC
  Administered 2019-11-26: 5 mg via INTRAVENOUS
  Filled 2019-11-26: qty 5

## 2019-11-26 MED ORDER — AMIODARONE HCL IN DEXTROSE 360-4.14 MG/200ML-% IV SOLN: 60.0000 mg/h | INTRAVENOUS | Status: DC

## 2019-11-26 MED ORDER — LORAZEPAM 2 MG/ML IJ SOLN
0.5000 mg | Freq: Once | INTRAMUSCULAR | Status: AC
  Administered 2019-11-26: 0.5 mg via INTRAVENOUS
  Filled 2019-11-26: qty 1

## 2019-11-26 MED ORDER — DOXYCYCLINE HYCLATE 100 MG PO TABS
100.0000 mg | ORAL_TABLET | Freq: Two times a day (BID) | ORAL | Status: DC
  Administered 2019-11-26 – 2019-11-27 (×3): 100 mg via ORAL
  Filled 2019-11-26 (×3): qty 1

## 2019-11-26 NOTE — Progress Notes (Signed)
Pt had troponin ordered to be collected at 0643. Lab reported that the specimen was sent down at 0712 and should be resulting soon.   Deniece Rankin M

## 2019-11-26 NOTE — Progress Notes (Signed)
Inpatient Diabetes Program Recommendations  AACE/ADA: New Consensus Statement on Inpatient Glycemic Control (2015)  Target Ranges:  Prepandial:   less than 140 mg/dL      Peak postprandial:   less than 180 mg/dL (1-2 hours)      Critically ill patients:  140 - 180 mg/dL   Lab Results  Component Value Date   GLUCAP 323 (H) 11/26/2019   HGBA1C 6.9 (H) 11/23/2019    Results for MARVELOUS, WOOLFORD (MRN [id number]) as of 11/26/2019 12:20  Ref. Range 11/25/2019 06:02 11/25/2019 11:42 11/25/2019 16:42 11/25/2019 20:59 11/26/2019 05:59  Glucose-Capillary Latest Ref Range: 70 - 99 mg/dL 329 (H) 518 (H) 841 (H) 255 (H) 323 (H)   Review of Glycemic Control  Diabetes history: Type 2 DM Outpatient Diabetes medications: Metformin 500 mg BID Current orders for Inpatient glycemic control: Novolog 0-6 units TID, Lantus 10 units daily, Solumedrol 40 mg BID  Inpatient Diabetes Program Recommendations:    Noted Lantus 10 units started this morning.   Please also consider increasing correction,   Novolog 0-15 units tid Novolog 3 units tid with meals if post prandials remain elevated while on steroids (If eats at least 50% of meal)  Will continue to follow while inpatient.  Thank you, Dulce Sellar, RN, BSN Diabetes Coordinator Inpatient Diabetes Program [phone] (team pager from 8a-5p)

## 2019-11-26 NOTE — Progress Notes (Signed)
Cardiology Progress Note  Patient ID: Lindsey [name] MRN: [id number] DOB: 11/14/1930 Date of Encounter: 11/26/2019  Primary Cardiologist: No primary care provider on file.  Subjective   Chief Complaint: Still short of breath.  HPI: Shortness of breath overnight.  Chest x-ray pulmonary edema.  A. fib not controlled.  Given 80 mg IV Lasix.  Diltiazem added for better rate control.  ROS:  All other ROS reviewed and negative. Pertinent positives noted in the HPI.     Inpatient Medications  Scheduled Meds: . albuterol  2.5 mg Nebulization BID  . aspirin  81 mg Oral Daily  . atorvastatin  10 mg Oral Daily  . diltiazem  30 mg Oral Q6H  . docusate sodium  100 mg Oral QODAY  . doxycycline  100 mg Oral Q12H  . DULoxetine  60 mg Oral Daily  . furosemide  80 mg Intravenous Once  . gabapentin  100 mg Oral TID  . insulin aspart  0-6 Units Subcutaneous TID WC  . insulin glargine  10 Units Subcutaneous Daily  . losartan  25 mg Oral Daily  . meclizine  12.5 mg Oral Daily  . methylPREDNISolone (SOLU-MEDROL) injection  40 mg Intravenous Q12H  . metoprolol succinate  200 mg Oral Daily  . multivitamin with minerals  1 tablet Oral q morning - 10a  . pantoprazole  40 mg Oral q morning - 10a  . potassium chloride  40 mEq Oral BID  . rivaroxaban  15 mg Oral Daily  . sodium chloride flush  3 mL Intravenous Q12H  . tamsulosin  0.4 mg Oral q morning - 10a   Continuous Infusions:  PRN Meds: acetaminophen **OR** acetaminophen, albuterol, diclofenac Sodium, hydrALAZINE, ipratropium-albuterol, meclizine, ondansetron **OR** ondansetron (ZOFRAN) IV, SUMAtriptan   Vital Signs   Vitals:   11/25/19 1956 11/26/19 0217 11/26/19 0429 11/26/19 0554  BP: 127/84 130/89  133/79  Pulse: 98 95  (!) 115  Resp: 17 (!) 21 (!) 26 (!) 28  Temp: 97.8 F (36.6 C) 98.1 F (36.7 C)    TempSrc: Oral Oral    SpO2: 100% 100%  95%  Weight:      Height:        Intake/Output Summary (Last 24 hours) at 11/26/2019  0841 Last data filed at 11/26/2019 0756 Gross per 24 hour  Intake 600 ml  Output 300 ml  Net 300 ml   Last 3 Weights 11/25/2019 11/24/2019 11/23/2019  Weight (lbs) 154 lb 8.7 oz 155 lb 13.8 oz 156 lb 4.9 oz  Weight (kg) 70.1 kg 70.7 kg 70.9 kg      Telemetry  Overnight telemetry shows atrial fibrillation with heart rate in the 120s, which I personally reviewed.   ECG  The most recent ECG shows A. fib with RVR heart rate 111, which I personally reviewed.   Physical Exam   Vitals:   11/25/19 1956 11/26/19 0217 11/26/19 0429 11/26/19 0554  BP: 127/84 130/89  133/79  Pulse: 98 95  (!) 115  Resp: 17 (!) 21 (!) 26 (!) 28  Temp: 97.8 F (36.6 C) 98.1 F (36.7 C)    TempSrc: Oral Oral    SpO2: 100% 100%  95%  Weight:      Height:         Intake/Output Summary (Last 24 hours) at 11/26/2019 0841 Last data filed at 11/26/2019 0756 Gross per 24 hour  Intake 600 ml  Output 300 ml  Net 300 ml    Last 3 Weights 11/25/2019 11/24/2019 11/23/2019  Weight (lbs) 154 lb 8.7 oz 155 lb 13.8 oz 156 lb 4.9 oz  Weight (kg) 70.1 kg 70.7 kg 70.9 kg    Body mass index is 24.2 kg/m.   General: Tachypnea noted Head: Atraumatic, normal size  Eyes: PEERLA, EOMI  Neck: Supple, + JVD Endocrine: No thryomegaly Cardiac: Normal S1, S2; irregular rhythm Lungs: Crackles bilaterally Abd: Soft, nontender, no hepatomegaly  Ext: No edema, pulses 2+ Musculoskeletal: No deformities, BUE and BLE strength normal and equal Skin: Warm and dry, no rashes   Neuro: Alert and oriented to person, place, time, and situation, CNII-XII grossly intact, no focal deficits  Psych: Normal mood and affect   Labs  High Sensitivity Troponin:   Recent Labs  Lab 11/22/19 1814 11/23/19 0210 11/26/19 0700  TROPONINIHS 41* 45* 16     Cardiac EnzymesNo results for input(s): TROPONINI in the last 168 hours. No results for input(s): TROPIPOC in the last 168 hours.  Chemistry Recent Labs  Lab 11/22/19 1814 11/24/19 0702  11/25/19 0545  NA 137 137 137  K 3.8 3.8 4.2  CL 103 101 104  CO2 24 25 23   GLUCOSE 180* 187* 284*  BUN 14 13 21   CREATININE 0.69 0.77 0.90  CALCIUM 9.2 9.2 9.4  GFRNONAA >60 >60 57*  GFRAA >60 >60 >60  ANIONGAP 10 11 10     Hematology Recent Labs  Lab 11/22/19 1814 11/24/19 0702  WBC 11.3* 10.4  RBC 3.96 4.00  HGB 11.1* 11.4*  HCT 35.2* 35.6*  MCV 88.9 89.0  MCH 28.0 28.5  MCHC 31.5 32.0  RDW 14.0 13.8  PLT 369 326   BNP Recent Labs  Lab 11/23/19 0518  BNP 687.3*    DDimer  Recent Labs  Lab 11/23/19 0518  DDIMER 0.57*     Radiology  CT CHEST WO CONTRAST  Result Date: 11/24/2019 CLINICAL DATA:  Chronic shortness of breath. EXAM: CT CHEST WITHOUT CONTRAST TECHNIQUE: Multidetector CT imaging of the chest was performed following the standard protocol without IV contrast. COMPARISON:  Chest radiograph 11/22/2019 FINDINGS: Cardiovascular: Mild cardiomegaly. No pericardial effusion. Coronary artery and aortic calcified atherosclerosis. Mediastinum/Nodes: No enlarged mediastinal or axillary lymph nodes. Thyroid gland, trachea, and esophagus demonstrate no significant findings. Lungs/Pleura: Diffuse bronchial wall thickening. Ground-glass opacities within the dependent lungs and lingula, compatible with atelectasis. Mild interlobular septal thickening, most conspicuous in the lung bases. Small bilateral pleural effusions. No pneumothorax Upper Abdomen: No acute abnormality. Calcifications along the left diaphragm. Musculoskeletal: Multilevel degenerative changes of the spine. No evidence of acute osseous abnormality. IMPRESSION: 1. Mild interstitial edema with small bilateral pleural effusions and mild cardiomegaly. 2. Diffuse bronchial wall thickening, as can be seen with bronchitis. 3. Atelectasis without focal consolidation. Electronically Signed   By: 11/25/19 MD   On: 11/24/2019 10:34   DG CHEST PORT 1 VIEW  Result Date: 11/26/2019 CLINICAL DATA:  Dyspnea, rapid  response EXAM: PORTABLE CHEST 1 VIEW COMPARISON:  11/24/2019, 11/22/2019 FINDINGS: Single frontal view of the chest demonstrates a stable cardiac silhouette. There is increased central vascular congestion and interstitial prominence, with progressive left basilar consolidation. Small bilateral pleural effusions are noted. No pneumothorax. No acute bony abnormalities. IMPRESSION: 1. Worsening volume status, with increased interstitial edema and small bilateral pleural effusions. 2. Increasing left basilar consolidation may reflect atelectasis. Electronically Signed   By: 01/26/2020 M.D.   On: 11/26/2019 03:01    Cardiac Studies  TTE 11/23/2019 1. Left ventricular ejection fraction, by estimation, is 40 to 45%. The  left ventricle has mildly decreased function. The left ventricle  demonstrates regional wall motion abnormalities (see scoring  diagram/findings for description). There is moderate  asymmetric left ventricular hypertrophy of the basal-septal segment. Left  ventricular diastolic function could not be evaluated. There is severe  hypokinesis of the left ventricular, entire septal wall, apical segment  and inferoseptal wall.  2. Right ventricular systolic function is mildly reduced. The right  ventricular size is normal.  3. Left atrial size was severely dilated.  4. The mitral valve is abnormal. Mild mitral valve regurgitation.  5. The aortic valve is tricuspid. Aortic valve regurgitation is mild.  Mild aortic valve sclerosis is present, with no evidence of aortic valve  stenosis.   Patient Profile  Lindsey [name] is a 86 y.o. female with hypertension, CAD status post PCI paroxysmal atrial fibrillation on Xarelto, diabetes who was admitted on 11/22/2019 for acute decompensated systolic heart failure complicated by atrial fibrillation with RVR.  Assessment & Plan   1.  Acute systolic heart failure, EF 40 to 45%/atrial fibrillation with RVR -Overnight 9/1-9/2 developed more  shortness of breath with pulmonary edema.  Given Lasix.  A. fib rates not that controlled.  This is contributing. -Continue metoprolol succinate 200 mg daily.  I added diltiazem 30 mg every 6 hours.  Hopefully we can get her rates a little better. -We will give her an extra dose of 80 mg IV Lasix this afternoon.  BMP pending. -I did discuss with her daughter and she is had atrial fibrillation since April.  She is been on Xarelto since then.  Apparently no missed doses.  If rates are no better today we may consider cardioversion tomorrow.  I will keep her n.p.o. in case. -I highly suspect her cardiomyopathy is related to A. fib.  We will try to better rate control.  She is DNR/DNI.  She has expressed a desire for less invasive approach.  She also has difficulty swallowing.  If she is missed any doses Xarelto we will not be able to proceed with TEE/cardioversion. -continue losartan  2.  CAD -Daughter reports she had a stent placed about 5 years ago in [place].  I do not have those records.  Cardiac enzymes are negative.  She had no chest pain.  Echo does show possible septal wall motion abnormality.  Could be old infarct.  EKG without acute ischemic changes. -Continue aspirin and statin.  For questions or updates, please contact [facility] Please consult [email or web address] for contact info under   Time Spent with Patient: I have spent a total of 25 minutes with patient reviewing hospital notes, telemetry, EKGs, labs and examining the patient as well as establishing an assessment and plan that was discussed with the patient.  > 50% of time was spent in direct patient care.    Signed, Lenna Gilford. Flora Lipps, MD [facility]  [facility]  11/26/2019 8:41 AM

## 2019-11-26 NOTE — Significant Event (Signed)
Rapid Response Event Note   Reason for Call :  Called d/t SOB.  Initial Focused Assessment:  Pt laying in bed with eyes open, saying "I can't breathe."  Pt is alert and oriented, c/o chest pain/SOB. Pt seems very anxious. Pt breathing is tachypneic and mildly labored. Lung sounds with scattered crackles L worse than R. Skin warm and dry. HR-101, BP-130/89, RR-28, SpO2-100% on 3L [place].  Albuterol tx given at 0146.   Interventions:  EKG-Afib Duoneb(ordered PTA RRT) PCXR-1. Worsening volume status, with increased interstitial edema and small bilateral pleural effusions. 2. Increasing left basilar consolidation may reflect atelectasis. 80mg  lasix IV now Plan of Care:  Pt still labored after duoneb given, lungs with scattered crackles, and  chest xray showing interstitial edema. Give lasix and monitor response. Call RRT if further assistance needed.    Event Summary:   MD Notified: MD notified PTA RRT Call [phone]  Arrival Time:0230 End Time:0320  [id number], RN

## 2019-11-26 NOTE — Progress Notes (Addendum)
  Amiodarone Drug - Drug Interaction Consult Note  Potential drug-drug interactions noted below. Monitor as noted below.   Amiodarone is metabolized by the cytochrome P450 system and therefore has the potential to cause many drug interactions. Amiodarone has an average plasma half-life of 50 days (range 20 to 100 days).     There is potential for drug interactions to occur several weeks or months after stopping treatment and the onset of drug interactions may be slow after initiating amiodarone.   [x]  Statins: Increased risk of myopathy. Simvastatin- restrict dose to 20mg  daily. Other statins: counsel patients to report any muscle pain or weakness immediately.  [x]  Anticoagulants: Amiodarone can increase anticoagulant effect. Consider warfarin dose reduction. Patients should be monitored closely and the dose of anticoagulant altered accordingly, remembering that amiodarone levels take several weeks to stabilize.  - On Xarelto, potential DDI with new Amiodarone,  Medium risk.  should include routine monitoring for signs and symptoms of bleeding that may be caused by increased rivaroxaban exposure   []  Antiepileptics: Amiodarone can increase plasma concentration of phenytoin, the dose should be reduced. Note that small changes in phenytoin dose can result in large changes in levels. Monitor patient and counsel on signs of toxicity.  [x]  Beta blockers: increased risk of bradycardia, AV block and myocardial depression. Sotalol - avoid concomitant use.  --On Metoprolol succinate  [x]   Calcium channel blockers (diltiazem and verapamil): increased risk of bradycardia, AV block and myocardial depression.  []   Cyclosporine: Amiodarone increases levels of cyclosporine. Reduced dose of cyclosporine is recommended.  []  Digoxin dose should be halved when amiodarone is started.  []  Diuretics: increased risk of cardiotoxicity if hypokalemia occurs.  []  Oral hypoglycemic agents (glyburide, glipizide,  glimepiride): increased risk of hypoglycemia. Patient's glucose levels should be monitored closely when initiating amiodarone therapy.   []  Drugs that prolong the QT interval:  Torsades de pointes risk may be increased with concurrent use - avoid if possible.  Monitor QTc, also keep magnesium/potassium WNL if concurrent therapy can't be avoided. Antibiotics: e.g. fluoroquinolones, erythromycin. . Antiarrhythmics: e.g. quinidine, procainamide, disopyramide, sotalol. . Antipsychotics: e.g. phenothiazines, haloperidol.  . Lithium, tricyclic antidepressants, and methadone.   Thank You,   , RPh Clinical Pharmacist Please check AMION for all [facility] phone numbers After 10:00 PM, call Main Pharmacy [phone]  11/26/2019 4:18 PM

## 2019-11-26 NOTE — TOC Initial Note (Addendum)
Transition of Care [facility] - Initial/Assessment Note    Patient Details  Name: Lindsey [name] MRN: [id number] Date of Birth: May 02, 1930  [facility] of [facility] [place] CM/SW Contact:    Carmina Miller, LCSWA Phone Number: 11/26/2019, 4:35 PM  Clinical Narrative:                 CSW received consult for pt to go back to [facility] placement at time of discharge. CSW spoke with patient regarding PT recommendation of SNF placement at time of discharge. Patient expressed understanding of PT recommendation and is agreeable to SNF placement at time of discharge. CSW discussed insurance authorization process and provided Medicare SNF ratings list. Patient expressed being hopeful for rehab and to feel better soon. No further questions reported at this time. CSW to continue to follow and assist with discharge planning needs. CSW spoke with pt's daughter, Mardene Celeste [phone], discussed dc plans with her as well. CSW called and spoke with admissions at [facility] to confirm pt will be returning.   Expected Discharge Plan: Acute to Acute Transfer Barriers to Discharge: Continued Medical Work up, English as a second language teacher   Patient Goals and CMS Choice Patient states their goals for this hospitalization and ongoing recovery are:: Get better soon CMS Medicare.gov Compare Post Acute Care list provided to:: Patient Choice offered to / list presented to : Patient, Adult Children  Expected Discharge Plan and Services Expected Discharge Plan: Acute to Acute Transfer In-house Referral: Clinical Social Work     Living arrangements for the past 2 months: Assisted Living Facility                                      Prior Living Arrangements/Services Living arrangements for the past 2 months: Assisted Living Facility Lives with:: Self Patient language and need for interpreter reviewed:: Yes Do you feel safe going back to the place where you live?: Yes      Need for Family Participation in Patient  Care: Yes (Comment) Care giver support system in place?: Yes (comment)   Criminal Activity/Legal Involvement Pertinent to Current Situation/Hospitalization: No - Comment as needed  Activities of Daily Living      Permission Sought/Granted Permission sought to share information with : Family Supports, Oceanographer granted to share information with : Yes, Verbal Permission Granted  Share Information with NAME: Mason Jim  Permission granted to share info w AGENCY: ALF  Permission granted to share info w Relationship: Daughter  Permission granted to share info w Contact Information: [phone]  Emotional Assessment Appearance:: Appears stated age Attitude/Demeanor/Rapport: Gracious Affect (typically observed): Calm Orientation: : Oriented to Self, Oriented to Place, Oriented to  Time, Oriented to Situation Alcohol / Substance Use: Not Applicable Psych Involvement: No (comment)  Admission diagnosis:  Chest pain [R07.9] Patient Active Problem List   Diagnosis Date Noted  . Sepsis (HCC)   . COPD with acute exacerbation (HCC) 11/25/2019  . Chest pain 11/23/2019  . SIRS (systemic inflammatory response syndrome) (HCC) 11/23/2019  . Acute CHF (congestive heart failure) (HCC) 11/23/2019  . Unspecified atrial fibrillation (HCC) 11/23/2019  . Chronic anticoagulation 11/23/2019  . Acute lower UTI 11/23/2019  . DNR (do not resuscitate) 11/23/2019   PCP:  Patient, No Pcp Per Pharmacy:  No Pharmacies Listed    Social Determinants of Health (SDOH) Interventions    Readmission Risk Interventions Readmission Risk Prevention Plan 11/26/2019  Transportation Screening Complete  PCP or Specialist Appt within 3-5 Days Complete  HRI or Home Care Consult Complete  Social Work Consult for Recovery Care Planning/Counseling Complete  Palliative Care Screening Not Applicable  Medication Review Oceanographer) Complete

## 2019-11-26 NOTE — Progress Notes (Signed)
Nurse paged about red MEWS score for tachypnea and tachycardia. I evaluated patient at bedside. Upon entering the room she is breathing fast, but as soon as I start talking to her, her RR slows. She reports that she feels as though the loses her breath and can't get it back. She reports these "spells" last 1-2 minutes. She has a "tiny" chest pain in the center of her chest, she thinks. She reports that it is hard to tell. Nothing makes these "spells" come on. They resolve spontaneously. She reports that this has been ongoing for 2 weeks. During the interview, I checked a radial pulse, which was trending down. Then when I auscultated chest, RR and HR trending back up. Possibly anxiety related?   As for her "worsening volume status" on CXR and lasix - lasix had no effect. I considered giving albumin, but patient's last albumin was normal. Bladder scan less than 70. Possibly intravascularly dry. Will defer to day team.   Will continue to monitor.

## 2019-11-26 NOTE — Progress Notes (Addendum)
PROGRESS NOTE    Lindsey [name]   YQM:[id number]  DOB: 07/11/1930  DOA: 11/22/2019 PCP: Patient, No Pcp Per   Brief Narrative:  Lindsey [name] is an 86 y.o. female from assisted living with a past medical history significant for essential hypertension, paroxysmal atrial fibrillation on Eliquis, diabetes mellitus type 2 comes in complaining of shortness of breath that started 1 week prior to admission.  She was recently seen in the ED on 11/15/2019 with reports of not feeling well was found to have a UTI started on antibiotics.  In the ED she was noted to have a temperature of 100.4, heart rate of 111, respiratory rate in the high 20s with a slightly elevated WBC count of 11.3. Chest x-ray was unrevealing Covid PCR was negative   Subjective: Episode of dyspnea and tachycardia last night. CXR showed CHF.  Given IV Lasix.  She continues to have these episodes today and told the night provider that she has had them on and off for 2 wks now.     Assessment & Plan:   Principal Problem: Acute COPD exacerbation with acute bronchitis- sepsis- Low-grade fever, tachycardia and tachypnea -She was started on IV steroids and IV antibiotics (doxycycline) on 8/31  - 9/1> She was quite weak today and still very short of breath 87% when walking and therefore is not quite ready to go home yet. - I will reduce her Solumedrol from BID to QD as it may be adding to underlying anxiety    Active Problems:   Chest pain   Acute CHF (congestive heart failure)   A-fib with RVR - continue attempts for rate control- appreciate cardiology follow up- plan for DCCV tomorrow if she does not improve - CHF is likely mediated by her A-fib- cont diuretics per cardiology- EF is 40-45% and there is focal hypokinesis (see ECHO below)- RV systolic function also mildly reduced  ? Panic attacks - she has had "episodes" of shortness of breath and racing heart for about 2 wks now - difficult to determine if her anxiety  stems from her A-fib RVR or if the anxiety is driving the RVR- - I would like to give her a dose of Ativan to see how she responds but records state she is allergic to Diazepam - the reaction is not mentioned in Epic- her daughter [name] does not know the reaction and her daughter Mardene Celeste is not picking up the phone  Addendum: patient has gotten agitated from Diazepam in the past but her daughter Mardene Celeste who is a nurse does not mind trying a benzodiazepine again- will give 0.5 mg IV Ativan and follow response.   DM 2, controlled - sugars are elevated to 2-300s from steroids- I have added Lantus this AM- cont Novolog - A1c on 8/30 was 6.9  UTI? -UA  on 8/30 was negative    Time spent in minutes: 35 DVT prophylaxis: Xarelto Code Status: DO NOT RESUSCITATE Family Communication: both daughters today Disposition Plan:  Status is: Inpatient  Remains inpatient appropriate because:Acutely hypoxic secondary to COPD and acute bronchitis, CHF and ongoing A-fib with RVR and possibly panic attacks   Dispo: The patient is from: Home              Anticipated d/c is to: Home              Anticipated d/c date is: 2 days              Patient currently is not medically stable to d/c.  Consultants:   Cardiology Procedures:     2D echo  1. Left ventricular ejection fraction, by estimation, is 40 to 45%. The  left ventricle has mildly decreased function. The left ventricle  demonstrates regional wall motion abnormalities (see scoring  diagram/findings for description). There is moderate  asymmetric left ventricular hypertrophy of the basal-septal segment. Left  ventricular diastolic function could not be evaluated. There is severe  hypokinesis of the left ventricular, entire septal wall, apical segment  and inferoseptal wall.  2. Right ventricular systolic function is mildly reduced. The right  ventricular size is normal.  3. Left atrial size was severely dilated.  4. The mitral valve is  abnormal. Mild mitral valve regurgitation.  5. The aortic valve is tricuspid. Aortic valve regurgitation is mild.  Mild aortic valve sclerosis is present, with no evidence of aortic valve  stenosis.  Antimicrobials:  Anti-infectives (From admission, onward)   Start     Dose/Rate Route Frequency Ordered Stop   11/26/19 1000  doxycycline (VIBRA-TABS) tablet 100 mg        100 mg Oral Every 12 hours 11/26/19 0751     11/24/19 0900  doxycycline (VIBRAMYCIN) 100 mg in sodium chloride 0.9 % 250 mL IVPB  Status:  Discontinued        100 mg 125 mL/hr over 120 Minutes Intravenous Every 12 hours 11/24/19 0803 11/26/19 0751       Objective: Vitals:   11/25/19 1956 11/26/19 0217 11/26/19 0429 11/26/19 0554  BP: 127/84 130/89  133/79  Pulse: 98 95  (!) 115  Resp: 17 (!) 21 (!) 26 (!) 28  Temp: 97.8 F (36.6 C) 98.1 F (36.7 C)    TempSrc: Oral Oral    SpO2: 100% 100%  95%  Weight:      Height:        Intake/Output Summary (Last 24 hours) at 11/26/2019 1317 Last data filed at 11/26/2019 1000 Gross per 24 hour  Intake 270 ml  Output 900 ml  Net -630 ml   Filed Weights   11/23/19 1716 11/24/19 0200 11/25/19 0419  Weight: 70.9 kg 70.7 kg 70.1 kg    Examination: General exam: Appears severely fatigued HEENT: PERRLA, oral mucosa moist, no sclera icterus or thrush Respiratory system: mild rhonchi- basilar crackles- RR fluctuates between rapid and normal while I am in the room. Cardiovascular system: S1 & S2 heard,  No murmurs - IIRR- tachycardic Gastrointestinal system: Abdomen soft, non-tender, nondistended. Normal bowel sounds   Central nervous system: Alert and oriented. No focal neurological deficits. Extremities: No cyanosis, clubbing or edema Skin: No rashes or ulcers Psychiatry:  depressed    Data Reviewed: I have personally reviewed following labs and imaging studies  CBC: Recent Labs  Lab 11/22/19 1814 11/24/19 0702  WBC 11.3* 10.4  HGB 11.1* 11.4*  HCT 35.2* 35.6*    MCV 88.9 89.0  PLT 369 326   Basic Metabolic Panel: Recent Labs  Lab 11/22/19 1814 11/24/19 0702 11/25/19 0545 11/26/19 0910  NA 137 137 137 137  K 3.8 3.8 4.2 4.4  CL 103 101 104 105  CO2 24 25 23  21*  GLUCOSE 180* 187* 284* 270*  BUN 14 13 21  37*  CREATININE 0.69 0.77 0.90 1.39*  CALCIUM 9.2 9.2 9.4 9.4   GFR: Estimated Creatinine Clearance: 27.7 mL/min (A) (by C-G formula based on SCr of 1.39 mg/dL (H)). Liver Function Tests: No results for input(s): AST, ALT, ALKPHOS, BILITOT, PROT, ALBUMIN in the last 168 hours. No results  for input(s): LIPASE, AMYLASE in the last 168 hours. No results for input(s): AMMONIA in the last 168 hours. Coagulation Profile: No results for input(s): INR, PROTIME in the last 168 hours. Cardiac Enzymes: No results for input(s): CKTOTAL, CKMB, CKMBINDEX, TROPONINI in the last 168 hours. BNP (last 3 results) No results for input(s): PROBNP in the last 8760 hours. HbA1C: Recent Labs    11/23/19 1850  HGBA1C 6.9*   CBG: Recent Labs  Lab 11/25/19 1142 11/25/19 1642 11/25/19 2059 11/26/19 0559 11/26/19 1258  GLUCAP 295* 320* 255* 323* 234*   Lipid Profile: Recent Labs    11/25/19 0545  CHOL 137  HDL 39*  LDLCALC 83  TRIG 74  CHOLHDL 3.5   Thyroid Function Tests: Recent Labs    11/23/19 1850  TSH 0.991   Anemia Panel: No results for input(s): VITAMINB12, FOLATE, FERRITIN, TIBC, IRON, RETICCTPCT in the last 72 hours. Urine analysis:    Component Value Date/Time   COLORURINE YELLOW 11/23/2019 0630   APPEARANCEUR HAZY (A) 11/23/2019 0630   LABSPEC 1.014 11/23/2019 0630   PHURINE 5.0 11/23/2019 0630   GLUCOSEU NEGATIVE 11/23/2019 0630   HGBUR NEGATIVE 11/23/2019 0630   BILIRUBINUR NEGATIVE 11/23/2019 0630   KETONESUR NEGATIVE 11/23/2019 0630   PROTEINUR NEGATIVE 11/23/2019 0630   NITRITE NEGATIVE 11/23/2019 0630   LEUKOCYTESUR NEGATIVE 11/23/2019 0630   Sepsis  Labs: @LABRCNTIP (procalcitonin:4,lacticidven:4) ) Recent Results (from the past 240 hour(s))  SARS Coronavirus 2 by RT PCR (hospital order, performed in [facility] hospital lab) Nasopharyngeal Nasopharyngeal Swab     Status: None   Collection Time: 11/23/19  5:18 AM   Specimen: Nasopharyngeal Swab  Result Value Ref Range Status   SARS Coronavirus 2 NEGATIVE NEGATIVE Final    Comment: (NOTE) SARS-CoV-2 target nucleic acids are NOT DETECTED.  The SARS-CoV-2 RNA is generally detectable in upper and lower respiratory specimens during the acute phase of infection. The lowest concentration of SARS-CoV-2 viral copies this assay can detect is 250 copies / mL. A negative result does not preclude SARS-CoV-2 infection and should not be used as the sole basis for treatment or other patient management decisions.  A negative result may occur with improper specimen collection / handling, submission of specimen other than nasopharyngeal swab, presence of viral mutation(s) within the areas targeted by this assay, and inadequate number of viral copies (<250 copies / mL). A negative result must be combined with clinical observations, patient history, and epidemiological information.  Fact Sheet for Patients:   11/25/19  Fact Sheet for Healthcare Providers: BoilerBrush.com.cy  This test is not yet approved or  cleared by the https://pope.com/ FDA and has been authorized for detection and/or diagnosis of SARS-CoV-2 by FDA under an Emergency Use Authorization (EUA).  This EUA will remain in effect (meaning this test can be used) for the duration of the COVID-19 declaration under Section 564(b)(1) of the Act, 21 U.S.C. section 360bbb-3(b)(1), unless the authorization is terminated or revoked sooner.  Performed at [facility], [address], [place], [place]   Culture, blood (routine x 2)     Status: None (Preliminary result)    Collection Time: 11/23/19  7:50 PM   Specimen: BLOOD LEFT HAND  Result Value Ref Range Status   Specimen Description BLOOD LEFT HAND  Final   Special Requests   Final    BOTTLES DRAWN AEROBIC ONLY Blood Culture adequate volume   Culture   Final    NO GROWTH 3 DAYS Performed at [facility], [address] [place], [place] [address]    Report Status PENDING  Incomplete  Culture, blood (routine x 2)     Status: None (Preliminary result)   Collection Time: 11/23/19  7:51 PM   Specimen: BLOOD RIGHT ARM  Result Value Ref Range Status   Specimen Description BLOOD RIGHT ARM  Final   Special Requests   Final    BOTTLES DRAWN AEROBIC ONLY Blood Culture adequate volume   Culture   Final    NO GROWTH 3 DAYS Performed at [facility], [address] [place], [place] [address]    Report Status PENDING  Incomplete         Radiology Studies: DG CHEST PORT 1 VIEW  Result Date: 11/26/2019 CLINICAL DATA:  Dyspnea, rapid response EXAM: PORTABLE CHEST 1 VIEW COMPARISON:  11/24/2019, 11/22/2019 FINDINGS: Single frontal view of the chest demonstrates a stable cardiac silhouette. There is increased central vascular congestion and interstitial prominence, with progressive left basilar consolidation. Small bilateral pleural effusions are noted. No pneumothorax. No acute bony abnormalities. IMPRESSION: 1. Worsening volume status, with increased interstitial edema and small bilateral pleural effusions. 2. Increasing left basilar consolidation may reflect atelectasis. Electronically Signed   By: Sharlet SalinaMichael  Brown M.D.   On: 11/26/2019 03:01      Scheduled Meds: . albuterol  2.5 mg Nebulization BID  . aspirin  81 mg Oral Daily  . atorvastatin  10 mg Oral Daily  . diltiazem  30 mg Oral Q6H  . docusate sodium  100 mg Oral QODAY  . doxycycline  100 mg Oral Q12H  . DULoxetine  60 mg Oral Daily  . furosemide  80 mg Intravenous Once  . gabapentin  100 mg Oral TID  . insulin aspart  0-6 Units  Subcutaneous TID WC  . insulin glargine  10 Units Subcutaneous Daily  . losartan  25 mg Oral Daily  . meclizine  12.5 mg Oral Daily  . methylPREDNISolone (SOLU-MEDROL) injection  40 mg Intravenous Q12H  . metoprolol succinate  200 mg Oral Daily  . multivitamin with minerals  1 tablet Oral q morning - 10a  . pantoprazole  40 mg Oral q morning - 10a  . potassium chloride  40 mEq Oral BID  . rivaroxaban  15 mg Oral Daily  . sodium chloride flush  3 mL Intravenous Q12H  . tamsulosin  0.4 mg Oral q morning - 10a   Continuous Infusions:    LOS: 3 days      Calvert CantorSaima Genine Beckett, MD [facility] Pager: [email or web address] 11/26/2019, 1:17 PM

## 2019-11-26 NOTE — Progress Notes (Signed)
CRITICAL VALUE STICKER  CRITICAL VALUE:Tro 36  RECEIVER (on-site recipient of call):Rekisha Welling   DATE & TIME NOTIFIED: 9/2 1021  MESSENGER (representative from lab):Quenten Raven   MD NOTIFIED:  Kathie Rhodes. Rizwan   TIME OF NOTIFICATION: 1023  RESPONSE: awaiting response  .

## 2019-11-27 ENCOUNTER — Inpatient Hospital Stay (HOSPITAL_COMMUNITY): Payer: Medicare Other

## 2019-11-27 DIAGNOSIS — I4811 Longstanding persistent atrial fibrillation: Secondary | ICD-10-CM

## 2019-11-27 DIAGNOSIS — Z7189 Other specified counseling: Secondary | ICD-10-CM

## 2019-11-27 DIAGNOSIS — I480 Paroxysmal atrial fibrillation: Secondary | ICD-10-CM

## 2019-11-27 DIAGNOSIS — Z515 Encounter for palliative care: Secondary | ICD-10-CM

## 2019-11-27 LAB — GLUCOSE, CAPILLARY
Glucose-Capillary: 206 mg/dL — ABNORMAL HIGH (ref 70–99)
Glucose-Capillary: 183 mg/dL — ABNORMAL HIGH (ref 70–99)

## 2019-11-27 LAB — SARS CORONAVIRUS 2 BY RT PCR (HOSPITAL ORDER, PERFORMED IN ~~LOC~~ HOSPITAL LAB): SARS Coronavirus 2: NEGATIVE

## 2019-11-27 SURGERY — CARDIOVERSION
Anesthesia: General

## 2019-11-27 MED ORDER — AMIODARONE HCL 200 MG PO TABS: 200.0000 mg | ORAL_TABLET | Freq: Every day | ORAL | 0 refills | Status: AC

## 2019-11-27 MED ORDER — LORAZEPAM 2 MG/ML PO CONC: 1.0000 mg | ORAL | Status: DC | PRN

## 2019-11-27 MED ORDER — AMIODARONE HCL 200 MG PO TABS
200.0000 mg | ORAL_TABLET | Freq: Two times a day (BID) | ORAL | Status: DC
  Filled 2019-11-27: qty 1

## 2019-11-27 MED ORDER — MORPHINE SULFATE 10 MG/5ML PO SOLN: 2.5000 mg | ORAL | Status: DC | PRN

## 2019-11-27 MED ORDER — LORAZEPAM 2 MG/ML IJ SOLN: 1.0000 mg | Freq: Once | INTRAMUSCULAR | Status: DC

## 2019-11-27 MED FILL — AMIODARONE HCL 200 MG TAB: 200 | 30 days supply | Qty: 30 | Fill #0

## 2019-11-27 NOTE — NC FL2 (Signed)
[place] MEDICAID FL2 LEVEL OF CARE SCREENING TOOL     IDENTIFICATION  Patient Name: Lindsey [name] Birthdate: 07-26-1930 Sex: female Admission Date (Current Location): 11/22/2019  [facility] and [place] Number:  Producer, television/film/video and Address:  [facility] [place], [address], [place], [place] [address]      Provider Number: [id number]  Attending Physician Name and Address:  Calvert Cantor, MD  Relative Name and Phone Number:  Mason Jim [phone]    Current Level of Care: Hospital Recommended Level of Care: Assisted Living Facility Prior Approval Number:    Date Approved/Denied:   PASRR Number:    Discharge Plan: Other (Comment) (ALF)    Current Diagnoses: Patient Active Problem List   Diagnosis Date Noted  . A-fib (HCC) 11/27/2019  . Palliative care by specialist   . Goals of care, counseling/discussion   . Terminal care   . Sepsis (HCC)   . COPD with acute exacerbation (HCC) 11/25/2019  . Chest pain 11/23/2019  . Acute CHF (congestive heart failure) (HCC) 11/23/2019  . Unspecified atrial fibrillation (HCC) 11/23/2019  . Chronic anticoagulation 11/23/2019  . Acute lower UTI 11/23/2019  . DNR (do not resuscitate) 11/23/2019    Orientation RESPIRATION BLADDER Height & Weight     Self, Situation, Place  O2 (3 Liters) Continent, External catheter Weight: 155 lb 8 oz (70.5 kg) Height:  5\' 7"  (170.2 cm)  BEHAVIORAL SYMPTOMS/MOOD NEUROLOGICAL BOWEL NUTRITION STATUS      Continent  (Regular Diet)  AMBULATORY STATUS COMMUNICATION OF NEEDS Skin   Limited Assist Verbally Normal                       Personal Care Assistance Level of Assistance  Bathing, Feeding, Dressing Bathing Assistance: Limited assistance Feeding assistance: Independent Dressing Assistance: Limited assistance     Functional Limitations Info  Sight, Hearing, Speech Sight Info: Adequate Hearing Info: Impaired Speech Info: Adequate    SPECIAL CARE FACTORS  FREQUENCY  PT (By licensed PT), OT (By licensed OT)     PT Frequency:  (n/a) OT Frequency:  (n/a)            Contractures Contractures Info: Not present    Additional Factors Info  Code Status, Allergies, Psychotropic, Isolation Precautions Code Status Info: DNR Allergies Info: Diazepam Drug Ingredient ,Monosodium Glutamate, Iodinated Diagnostic Agent, Nitrofurantoin, Pineapple, Savella Milnacipran, Shellfish Allergy, Sulfa Antibiotics, Tramadol Psychotropic Info: DULoxetine (CYMBALTA) DR capsule 60 mg daily, gabapentin (NEURONTIN) capsule 100 mg 3x daily Insulin Sliding Scale Info:  (N/A)       Current Medications (11/27/2019):  This is the current hospital active medication list Current Facility-Administered Medications  Medication Dose Route Frequency Provider Last Rate Last Admin  . acetaminophen (TYLENOL) tablet 650 mg  650 mg Oral Q6H PRN 01/27/2020 A, MD   650 mg at 11/26/19 0427   Or  . acetaminophen (TYLENOL) suppository 650 mg  650 mg Rectal Q6H PRN 01/26/20 A, MD      . albuterol (PROVENTIL) (2.5 MG/3ML) 0.083% nebulizer solution 2.5 mg  2.5 mg Nebulization Q2H PRN Madelyn Flavors, MD   2.5 mg at 11/26/19 2323  . albuterol (PROVENTIL) (2.5 MG/3ML) 0.083% nebulizer solution 2.5 mg  2.5 mg Nebulization BID 2324, MD   2.5 mg at 11/27/19 0746  . amiodarone (PACERONE) tablet 200 mg  200 mg Oral BID 01/27/20, PA-C      . diclofenac Sodium (VOLTAREN) 1 % topical gel  2 g  2 g Topical Daily PRN Madelyn Flavors A, MD   2 g at 11/23/19 2127  . docusate sodium (COLACE) capsule 100 mg  100 mg Oral QODAY Smith, Rondell A, MD   100 mg at 11/27/19 0920  . doxycycline (VIBRA-TABS) tablet 100 mg  100 mg Oral Q12H Calvert Cantor, MD   100 mg at 11/27/19 0920  . DULoxetine (CYMBALTA) DR capsule 60 mg  60 mg Oral Daily Madelyn Flavors A, MD   60 mg at 11/27/19 0919  . gabapentin (NEURONTIN) capsule 100 mg  100 mg Oral TID Madelyn Flavors A, MD   100 mg at  11/27/19 0919  . hydrALAZINE (APRESOLINE) injection 10 mg  10 mg Intravenous Q4H PRN Smith, Rondell A, MD      . insulin aspart (novoLOG) injection 0-6 Units  0-6 Units Subcutaneous TID WC Madelyn Flavors A, MD   2 Units at 11/27/19 0730  . insulin glargine (LANTUS) injection 10 Units  10 Units Subcutaneous Daily Calvert Cantor, MD   10 Units at 11/27/19 0934  . ipratropium-albuterol (DUONEB) 0.5-2.5 (3) MG/3ML nebulizer solution 3 mL  3 mL Nebulization Q6H PRN Zierle-Ghosh, Asia B, DO      . LORazepam (ATIVAN) 2 MG/ML concentrated solution 1 mg  1 mg Oral Q3H PRN Ernie Avena, NP      . LORazepam (ATIVAN) injection 1 mg  1 mg Intravenous Once Zierle-Ghosh, Asia B, DO      . losartan (COZAAR) tablet 25 mg  25 mg Oral Daily Furth, Cadence H, PA-C   25 mg at 11/27/19 0919  . meclizine (ANTIVERT) tablet 12.5 mg  12.5 mg Oral Daily Katrinka Blazing, Rondell A, MD   12.5 mg at 11/27/19 0920  . meclizine (ANTIVERT) tablet 12.5 mg  12.5 mg Oral QHS PRN Madelyn Flavors A, MD      . morphine 10 MG/5ML solution 2.5 mg  2.5 mg Oral Q3H PRN Ernie Avena, NP      . multivitamin with minerals tablet 1 tablet  1 tablet Oral q morning - 10a Clydie Braun, MD   1 tablet at 11/27/19 0919  . ondansetron (ZOFRAN) tablet 4 mg  4 mg Oral Q6H PRN Madelyn Flavors A, MD       Or  . ondansetron (ZOFRAN) injection 4 mg  4 mg Intravenous Q6H PRN Smith, Rondell A, MD      . pantoprazole (PROTONIX) EC tablet 40 mg  40 mg Oral q morning - 10a Madelyn Flavors A, MD   40 mg at 11/27/19 0920  . Rivaroxaban (XARELTO) tablet 15 mg  15 mg Oral Daily Calvert Cantor, MD   15 mg at 11/27/19 0921  . SUMAtriptan (IMITREX) tablet 50 mg  50 mg Oral Q2H PRN Smith, Rondell A, MD      . tamsulosin (FLOMAX) capsule 0.4 mg  0.4 mg Oral q morning - 10a Madelyn Flavors A, MD   0.4 mg at 11/27/19 0919     Discharge Medications: Please see discharge summary for a list of discharge medications.  Relevant Imaging Results:  Relevant Lab  Results:   Additional Information ssn [id number]  Carmina Miller, LCSWA

## 2019-11-27 NOTE — Consult Note (Addendum)
ELECTROPHYSIOLOGY CONSULT NOTE    Patient ID: Lindsey [name] MRN: [id number], DOB/AGE: 29-Jan-1931 86 y.o.  Admit date: 11/22/2019 Date of Consult: 11/27/2019  Primary Physician: Patient, No Pcp Per Primary Cardiologist: No primary care provider on file.  Electrophysiologist: New  Referring Provider: Dr Flora Lipps  Patient Profile: Lindsey [name] is a 86 y.o. female with a history of PAF, HTN, HLD, who is being seen today for the evaluation of AF with RVR and slow VR at the request of Dr. Scharlene Gloss.  HPI:  Lindsey [name] is a 86 y.o. female with medical history above.   Pt is new to the cardiology practice this admission. She recently moved here in June from [place] and had not established care with a cardiologist. Patient said she previously followed with a Dr. Newt Lukes from ?[place]. She has a history of PAF on Xarelto. The patient says this is a relatively new diagnosis. She also reported prior coronary stenting within the last 5 years. Tried to confirm history by her daughter but unable to reach her. Patient lives in [facility] and able to perform ADLs. Remote smoking history. No alcohol or tobacco use. She was seen in the [facility] ED 8/22 with reports of not feeling well found to have UTI.   Retuerned to [facility] via EMS 11/22/19 for 1 week of worsening SOB with intermittent CP. + DOE and Orthopnea. Denied LE edema and reported stable weights.  Denied fever, chills, cough. No abd pain, nausea, or vomiting. EMS was called administered 324mg  ASA and SL nitro x 1, which did not improve her symptoms.    In the ED BP 153/73, pulse 83, afebrile, RR 21, 95% O2. COVID negative. Labs showed creatinine 0.69, stable electrolytes, WBC 11.3, Hgb 11.1. D-dimer 0.57, fibrinogen 510. CRP 2.4. BNP 687. HS troponin 41>45. EKG showed afib RVR with possible LVH diffuse TWI. CXR unremarkable.  Patient was admitted for further work-up.  Pt is intermittently SOB vs highly anxious per daughter who is present.   Pt has explicitly stated she is not interested in Pacemaker or any invasive surgeries. She has previously refused cardioversion but is willing to consider.   Past Medical History:  Diagnosis Date   Hyperlipidemia    Hypertension    Paroxysmal A-fib (HCC)    Vertigo      Surgical History: History reviewed. No pertinent surgical history.   Medications Prior to Admission  Medication Sig Dispense Refill Last Dose   aspirin 81 MG chewable tablet Chew 81 mg by mouth every morning.    11/22/2019 at 1000   atorvastatin (LIPITOR) 10 MG tablet Take 10 mg by mouth every morning.    11/22/2019 at 1000   cephALEXin (KEFLEX) 500 MG capsule Take 2 capsules (1,000 mg total) by mouth 2 (two) times daily. 28 capsule 0 11/23/2019 at 0800   diclofenac Sodium (VOLTAREN) 1 % GEL Apply 2 g topically daily as needed (pain).   unknown   docusate sodium (COLACE) 100 MG capsule Take 100 mg by mouth every other day.   11/21/2019 at 1000   DULoxetine (CYMBALTA) 60 MG capsule Take 60 mg by mouth every morning.    11/22/2019 at 1000   furosemide (LASIX) 40 MG tablet Take 40 mg by mouth every morning.    11/22/2019 at 1000   gabapentin (NEURONTIN) 100 MG capsule Take 100 mg by mouth 3 (three) times daily. 10a, 4p, 10p   11/22/2019 at 1600   meclizine (ANTIVERT) 12.5 MG tablet Take 12.5 mg by mouth See admin  instructions. Take one tablet (12.5 mg) by mouth daily at 8am, may also take one tablet (12.5 mg) every 12 hours as needed for dizziness - give 2nd dose at least 4 hours are morning scheduled dose   11/23/2019 at 0800   metFORMIN (GLUCOPHAGE) 500 MG tablet Take 500 mg by mouth in the morning and at bedtime.   11/22/2019 at 1000   metoprolol succinate (TOPROL-XL) 100 MG 24 hr tablet Take 100 mg by mouth every morning. Take with or immediately following a meal.    11/22/2019 at 1000   Multiple Vitamin (MULTIVITAMIN WITH MINERALS) TABS tablet Take 1 tablet by mouth every morning.    11/22/2019 at 1000   pantoprazole (PROTONIX) 40 MG  tablet Take 40 mg by mouth every morning.    11/22/2019 at 1000   potassium chloride (KLOR-CON) 10 MEQ tablet Take 10 mEq by mouth every Monday, Wednesday, and Friday.   11/20/2019 at 1000   rivaroxaban (XARELTO) 20 MG TABS tablet Take 20 mg by mouth every morning.    11/22/2019 at 1000   SUMAtriptan (IMITREX) 50 MG tablet Take 50 mg by mouth every 2 (two) hours as needed for migraine.    unknown   tamsulosin (FLOMAX) 0.4 MG CAPS capsule Take 0.4 mg by mouth every morning.    11/22/2019 at 1000   tiZANidine (ZANAFLEX) 2 MG tablet Take 2 mg by mouth every morning.    11/22/2019 at 1000    Inpatient Medications:   albuterol  2.5 mg Nebulization BID   aspirin  81 mg Oral Daily   atorvastatin  10 mg Oral Daily   docusate sodium  100 mg Oral QODAY   doxycycline  100 mg Oral Q12H   DULoxetine  60 mg Oral Daily   gabapentin  100 mg Oral TID   insulin aspart  0-6 Units Subcutaneous TID WC   insulin glargine  10 Units Subcutaneous Daily   LORazepam  1 mg Intravenous Once   losartan  25 mg Oral Daily   meclizine  12.5 mg Oral Daily   methylPREDNISolone (SOLU-MEDROL) injection  40 mg Intravenous Q24H   multivitamin with minerals  1 tablet Oral q morning - 10a   pantoprazole  40 mg Oral q morning - 10a   rivaroxaban  15 mg Oral Daily   sodium chloride flush  3 mL Intravenous Q12H   tamsulosin  0.4 mg Oral q morning - 10a    Allergies:  Allergies  Allergen Reactions   Diazepam     Unknown reaction   Drug Ingredient [Monosodium Glutamate]     MSG - Unknown reaction   Iodinated Diagnostic Agents     Unknown reaction   Nitrofurantoin     Unknown reaction   Pineapple     Unknown reaction   Savella [Milnacipran]     Unknown reaction   Shellfish Allergy     Unknown reaction   Sulfa Antibiotics     Unknown reaction   Tramadol     Unknown reaction    Social History   Socioeconomic History   Marital status: Unknown    Spouse name: Not on file   Number of children: Not on file   Years  of education: Not on file   Highest education level: Not on file  Occupational History   Not on file  Tobacco Use   Smoking status: Not on file  Substance and Sexual Activity   Alcohol use: Not on file   Drug use: Not on file  Sexual activity: Not on file  Other Topics Concern   Not on file  Social History Narrative   Not on file   Social Determinants of Health   Financial Resource Strain:    Difficulty of Paying Living Expenses: Not on file  Food Insecurity:    Worried About Running Out of Food in the Last Year: Not on file   Ran Out of Food in the Last Year: Not on file  Transportation Needs:    Lack of Transportation (Medical): Not on file   Lack of Transportation (Non-Medical): Not on file  Physical Activity:    Days of Exercise per Week: Not on file   Minutes of Exercise per Session: Not on file  Stress:    Feeling of Stress : Not on file  Social Connections:    Frequency of Communication with Friends and Family: Not on file   Frequency of Social Gatherings with Friends and Family: Not on file   Attends Religious Services: Not on file   Active Member of Clubs or Organizations: Not on file   Attends Banker Meetings: Not on file   Marital Status: Not on file  Intimate Partner Violence:    Fear of Current or Ex-Partner: Not on file   Emotionally Abused: Not on file   Physically Abused: Not on file   Sexually Abused: Not on file     History reviewed. No pertinent family history.   Review of Systems: All other systems reviewed and are otherwise negative except as noted above.  Physical Exam: Vitals:   11/27/19 0321 11/27/19 0400 11/27/19 0747 11/27/19 0914  BP: (!) 92/41   (!) 111/43  Pulse: (!) 56  78 (!) 57  Resp: 19  (!) 21 20  Temp: (!) 97.5 F (36.4 C)   98.1 F (36.7 C)  TempSrc: Oral   Oral  SpO2: 90%  94% 100%  Weight:  70.5 kg    Height:        GEN- The patient is elderly, cachectic, and chronically ill appearing. She is alert to  person and voice but is incredibly hard of hearing.  HEENT: normocephalic, atraumatic; sclera clear, conjunctiva pink; hearing intact; oropharynx clear; neck supple.  Lungs- Clear to ausculation bilaterally, normal work of breathing.  No wheezes, rales, rhonchi Heart- Irregularly irregular rate and rhythm, no murmurs, rubs or gallops GI- soft, non-tender, non-distended, bowel sounds present Extremities- no clubbing, cyanosis, or edema; Very cool to the touch.  MS- no significant deformity or atrophy Skin- Cool and dry, no rash or lesion Psych- Flat affect, depressed mood per daughter.   Labs:   Lab Results  Component Value Date   WBC 10.4 11/24/2019   HGB 11.4 (L) 11/24/2019   HCT 35.6 (L) 11/24/2019   MCV 89.0 11/24/2019   PLT 326 11/24/2019    Recent Labs  Lab 11/26/19 0910  NA 137  K 4.4  CL 105  CO2 21*  BUN 37*  CREATININE 1.39*  CALCIUM 9.4  GLUCOSE 270*      Radiology/Studies: DG Chest 2 View  Result Date: 11/22/2019 CLINICAL DATA:  Severe lower mid chest pain for 1 day, difficulty breathing EXAM: CHEST - 2 VIEW COMPARISON:  11/15/2019 FINDINGS: Frontal and lateral views of the chest demonstrate a stable cardiac silhouette allowing for differences in positioning and technique. No acute airspace disease, effusion, or pneumothorax. Stable atherosclerosis of the aorta. No acute bony abnormalities. IMPRESSION: 1. No acute intrathoracic process. Electronically Signed   By: Casimiro Needle  Manson Passey M.D.   On: 11/22/2019 19:26   CT CHEST WO CONTRAST  Result Date: 11/24/2019 CLINICAL DATA:  Chronic shortness of breath. EXAM: CT CHEST WITHOUT CONTRAST TECHNIQUE: Multidetector CT imaging of the chest was performed following the standard protocol without IV contrast. COMPARISON:  Chest radiograph 11/22/2019 FINDINGS: Cardiovascular: Mild cardiomegaly. No pericardial effusion. Coronary artery and aortic calcified atherosclerosis. Mediastinum/Nodes: No enlarged mediastinal or axillary lymph  nodes. Thyroid gland, trachea, and esophagus demonstrate no significant findings. Lungs/Pleura: Diffuse bronchial wall thickening. Ground-glass opacities within the dependent lungs and lingula, compatible with atelectasis. Mild interlobular septal thickening, most conspicuous in the lung bases. Small bilateral pleural effusions. No pneumothorax Upper Abdomen: No acute abnormality. Calcifications along the left diaphragm. Musculoskeletal: Multilevel degenerative changes of the spine. No evidence of acute osseous abnormality. IMPRESSION: 1. Mild interstitial edema with small bilateral pleural effusions and mild cardiomegaly. 2. Diffuse bronchial wall thickening, as can be seen with bronchitis. 3. Atelectasis without focal consolidation. Electronically Signed   By: Feliberto Harts MD   On: 11/24/2019 10:34   DG CHEST PORT 1 VIEW  Result Date: 11/27/2019 CLINICAL DATA:  Shortness of breath, unable to fully arose from sleep in order to obtain history. Unable to remove necklace. EXAM: PORTABLE CHEST 1 VIEW.  The patient is rotated. COMPARISON:  Chest x-ray 11/26/2019, CT chest 11/24/2019 FINDINGS: Similar-appearing enlarged cardiac silhouette. Otherwise the mediastinal contours are unchanged. Aortic arch and descending thoracic aorta calcifications. Low lung volumes. Slight interval decrease in interstitial markings. Similar-appearing retrocardiac opacity. No new focal consolidation. No pulmonary edema. No definite right pleural effusion. Similar-appearing small left pleural effusion. The visualized skeletal structures demonstrate no acute findings. IMPRESSION: Similar-appearing retrocardiac opacity which may represent atelectasis versus infection/inflammation. Interval improvement of residual mild pulmonary edema. Similar-appearing small left pleural effusion. Interval resolution of right pleural effusion. Electronically Signed   By: Tish Frederickson M.D.   On: 11/27/2019 09:20   DG CHEST PORT 1 VIEW  Result  Date: 11/26/2019 CLINICAL DATA:  Dyspnea, rapid response EXAM: PORTABLE CHEST 1 VIEW COMPARISON:  11/24/2019, 11/22/2019 FINDINGS: Single frontal view of the chest demonstrates a stable cardiac silhouette. There is increased central vascular congestion and interstitial prominence, with progressive left basilar consolidation. Small bilateral pleural effusions are noted. No pneumothorax. No acute bony abnormalities. IMPRESSION: 1. Worsening volume status, with increased interstitial edema and small bilateral pleural effusions. 2. Increasing left basilar consolidation may reflect atelectasis. Electronically Signed   By: Sharlet Salina M.D.   On: 11/26/2019 03:01   DG Chest Port 1 View  Result Date: 11/15/2019 CLINICAL DATA:  Fever, lethargy EXAM: PORTABLE CHEST 1 VIEW COMPARISON:  None. FINDINGS: Single frontal view of the chest demonstrates an unremarkable cardiac silhouette. No airspace disease, effusion, or pneumothorax. No acute bony abnormalities. IMPRESSION: 1. No acute intrathoracic process. Electronically Signed   By: Sharlet Salina M.D.   On: 11/15/2019 15:05   ECHOCARDIOGRAM COMPLETE  Result Date: 11/23/2019    ECHOCARDIOGRAM REPORT   Patient Name:   Lindsey [name] Date of Exam: 11/23/2019 Medical Rec #:  [id number]        Height:       67.0 in Accession #:    [id number]       Weight:       158.0 lb Date of Birth:  04-08-1930        BSA:          1.829 m Patient Age:    89 years         BP:  155/73 mmHg Patient Gender: F                HR:           94 bpm. Exam Location:  Inpatient Procedure: 2D Echo, Cardiac Doppler and Color Doppler Indications:    Atrial Fibrillation 427.31 / I48.91  History:        Patient has no prior history of Echocardiogram examinations.                 Risk Factors:Hypertension.  Sonographer:    Eulah Pont RDCS Referring Phys: [id number] RONDELL A SMITH IMPRESSIONS  1. Left ventricular ejection fraction, by estimation, is 40 to 45%. The left ventricle has mildly  decreased function. The left ventricle demonstrates regional wall motion abnormalities (see scoring diagram/findings for description). There is moderate asymmetric left ventricular hypertrophy of the basal-septal segment. Left ventricular diastolic function could not be evaluated. There is severe hypokinesis of the left ventricular, entire septal wall, apical segment and inferoseptal wall.  2. Right ventricular systolic function is mildly reduced. The right ventricular size is normal.  3. Left atrial size was severely dilated.  4. The mitral valve is abnormal. Mild mitral valve regurgitation.  5. The aortic valve is tricuspid. Aortic valve regurgitation is mild. Mild aortic valve sclerosis is present, with no evidence of aortic valve stenosis. FINDINGS  Left Ventricle: Left ventricular ejection fraction, by estimation, is 40 to 45%. The left ventricle has mildly decreased function. The left ventricle demonstrates regional wall motion abnormalities. Severe hypokinesis of the left ventricular, entire septal wall, apical segment and inferoseptal wall. The left ventricular internal cavity size was normal in size. There is moderate asymmetric left ventricular hypertrophy of the basal-septal segment. Left ventricular diastolic function could not be evaluated due to atrial fibrillation. Left ventricular diastolic function could not be evaluated. Right Ventricle: The right ventricular size is normal. No increase in right ventricular wall thickness. Right ventricular systolic function is mildly reduced. Left Atrium: Left atrial size was severely dilated. Right Atrium: Right atrial size was normal in size. Pericardium: There is no evidence of pericardial effusion. Mitral Valve: The mitral valve is abnormal. There is mild thickening of the mitral valve leaflet(s). Mild mitral annular calcification. Mild mitral valve regurgitation. Tricuspid Valve: The tricuspid valve is grossly normal. Tricuspid valve regurgitation is trivial.  Aortic Valve: The aortic valve is tricuspid. Aortic valve regurgitation is mild. Aortic regurgitation PHT measures 456 msec. Mild aortic valve sclerosis is present, with no evidence of aortic valve stenosis. Pulmonic Valve: The pulmonic valve was not well visualized. Pulmonic valve regurgitation is not visualized. Aorta: The aortic root and ascending aorta are structurally normal, with no evidence of dilitation. IAS/Shunts: No atrial level shunt detected by color flow Doppler.  LEFT VENTRICLE PLAX 2D LVIDd:         5.10 cm LVIDs:         3.60 cm LV PW:         1.20 cm LV IVS:        1.20 cm LVOT diam:     2.20 cm LV SV:         45 LV SV Index:   25 LVOT Area:     3.80 cm  LV Volumes (MOD) LV vol d, MOD A2C: 69.8 ml LV vol d, MOD A4C: 78.7 ml LV vol s, MOD A2C: 43.4 ml LV vol s, MOD A4C: 46.0 ml LV SV MOD A2C:     26.4 ml LV SV MOD A4C:  78.7 ml LV SV MOD BP:      31.6 ml RIGHT VENTRICLE TAPSE (M-mode): 1.0 cm LEFT ATRIUM              Index       RIGHT ATRIUM           Index LA diam:        4.80 cm  2.62 cm/m  RA Area:     19.50 cm LA Vol (A2C):   117.0 ml 63.95 ml/m RA Volume:   49.00 ml  26.78 ml/m LA Vol (A4C):   83.9 ml  45.86 ml/m LA Biplane Vol: 98.5 ml  53.84 ml/m  AORTIC VALVE LVOT Vmax:   72.90 cm/s LVOT Vmean:  51.000 cm/s LVOT VTI:    0.118 m AI PHT:      456 msec  AORTA Ao Root diam: 3.10 cm Ao Asc diam:  3.00 cm MR Peak grad:    109.4 mmHg  TRICUSPID VALVE MR Mean grad:    68.0 mmHg   TR Peak grad:   31.1 mmHg MR Vmax:         523.00 cm/s TR Vmax:        279.00 cm/s MR Vmean:        388.0 cm/s MR PISA:         0.57 cm    SHUNTS MR PISA Eff ROA: 4 mm       Systemic VTI:  0.12 m MR PISA Radius:  0.30 cm     Systemic Diam: 2.20 cm Zoila Shutter MD Electronically signed by Zoila Shutter MD Signature Date/Time: 11/23/2019/4:42:56 PM    Final    EKG: on admission showed AF with RVR at 106 bpm (personally reviewed)  TELEMETRY: AF with rates in 100-110s yesterday, has gradually slowed down to 50s  overnight, at times dipping into upper 40s.  (personally reviewed)  Assessment/Plan: 1.  2.  A. fib with RVR now with SVR Rates initially 100-110s -> now 40-50s with diltiazem, Toprol, and amiodarone.  Was tentatively scheduled for [facility] today but cancelled with slow VR and mild ? Confusion.  She is on Xarelto chronically. Received a dose last night.  AV nodal agents held this am. Would resume low dose amiodarone and hold other agents for now.  If HRs improved out of 40-50s can increase amiodarone.   2. Acute systolic CHF EF 40-45% Possibly in setting of AF RVR. Rates improved to slow at this time Diuresis on going pending labwork today.  Losartan held with hypotension.   3. CAD Previously had PCI "5 years ago" in [place] Enzymes negative this admission. She is DNR/DNI not wanting aggressive measures.   4. Goals of Care Pt is a DNR/DNI and has expressed interest in speaking with palliative care team. They had a good experience with palliative care with pts husband 3 years ago.  We [facility] consult.   Would recommend loading po amiodarone through weekend and attempting DCCV early next week pending GOC discussions.   EP Kataleah Bejar see as needed while here. Please call back with further questions.   For questions or updates, please contact [facility] Please consult [email or web address] for contact info under Cardiology/STEMI.  Signed, Graciella Freer, PA-C  11/27/2019 10:37 AM   I have seen and examined this patient with Otilio Saber.  Agree with above, note added to reflect my findings.  On exam, irregular, no murmurs. Patient with rapid AF. Would plan for amiodarone load over the weekend followed by DCCV next  week. Would hold rate control for now unless needs further control and would increase amiodarone.    Dim Meisinger M. Maicee Ullman MD 11/29/2019 3:50 PM

## 2019-11-27 NOTE — Progress Notes (Signed)
PROGRESS NOTE    Lindsey [name]   UUV:[id number]  DOB: [birth year]  DOA: 11/22/2019 PCP: Patient, No Pcp Per   Brief Narrative:  Lindsey [name] is an 86 y.o. female from assisted living with a past medical history significant for essential hypertension, paroxysmal atrial fibrillation on Eliquis, diabetes mellitus type 2 comes in complaining of shortness of breath that started 1 week prior to admission.  She was recently seen in the ED on 11/15/2019 with reports of not feeling well was found to have a UTI started on antibiotics.  In the ED she was noted to have a temperature of 100.4, heart rate of 111, respiratory rate in the high 20s with a slightly elevated WBC count of 11.3. Chest x-ray was unrevealing Covid PCR was negative   Subjective: Feels very weak today. No other complaints.     Assessment & Plan:   Principal Problem: Acute COPD exacerbation with acute bronchitis- sepsis - Low-grade fever, tachycardia and tachypnea - She was started on IV steroids and IV antibiotics (doxycycline) on 8/31  -  - weaning Solumedrol now   Active Problems:  A-fib with RVR - started on Amiodarone IV- HR dropped to 40s- remains bradycardic today - ? Tachy-brady syndrome - cardiology consulting EP today for eval    Chest pain   Acute CHF (congestive heart failure)  - continue attempts for rate control as this is driving her CHF - appreciate cardiology follow up- - - cont diuretics per cardiology- EF is 40-45% and there is focal hypokinesis (see ECHO below)- RV systolic function also mildly reduced  ? Panic attacks - she has had "episodes" of shortness of breath and racing heart for about 2 wks now - difficult to determine if her anxiety stems from her A-fib RVR or if the anxiety is driving the RVR- -I gave her a small dose of Ativan  On 9/2 to help with her anxiety- this seemed to help  DM 2, controlled - sugars are elevated to 2-300s from steroids- I have added Lantus  - cont  Novolog - A1c on 8/30 was 6.9  UTI? -UA  on 8/30 was negative    Time spent in minutes: 35 DVT prophylaxis: Xarelto Code Status: DO NOT RESUSCITATE Family Communication: both daughters today Disposition Plan:  Status is: Inpatient  Remains inpatient appropriate because:Acutely hypoxic secondary to COPD and acute bronchitis, CHF and ongoing A-fib with RVR and possibly panic attacks- EP eval today- f/u on bradycardia and palliative care consult   Dispo: The patient is from: Home              Anticipated d/c is to: Home              Anticipated d/c date is: 2 days              Patient currently is not medically stable to d/c.    Consultants:   Cardiology Procedures:     2D echo  1. Left ventricular ejection fraction, by estimation, is 40 to 45%. The  left ventricle has mildly decreased function. The left ventricle  demonstrates regional wall motion abnormalities (see scoring  diagram/findings for description). There is moderate  asymmetric left ventricular hypertrophy of the basal-septal segment. Left  ventricular diastolic function could not be evaluated. There is severe  hypokinesis of the left ventricular, entire septal wall, apical segment  and inferoseptal wall.  2. Right ventricular systolic function is mildly reduced. The right  ventricular size is normal.  3. Left atrial size  was severely dilated.  4. The mitral valve is abnormal. Mild mitral valve regurgitation.  5. The aortic valve is tricuspid. Aortic valve regurgitation is mild.  Mild aortic valve sclerosis is present, with no evidence of aortic valve  stenosis.  Antimicrobials:  Anti-infectives (From admission, onward)   Start     Dose/Rate Route Frequency Ordered Stop   11/26/19 1000  doxycycline (VIBRA-TABS) tablet 100 mg        100 mg Oral Every 12 hours 11/26/19 0751     11/24/19 0900  doxycycline (VIBRAMYCIN) 100 mg in sodium chloride 0.9 % 250 mL IVPB  Status:  Discontinued        100 mg 125  mL/hr over 120 Minutes Intravenous Every 12 hours 11/24/19 0803 11/26/19 0751       Objective: Vitals:   11/27/19 0400 11/27/19 0747 11/27/19 0914 11/27/19 1151  BP:   (!) 111/43 (!) 104/53  Pulse:  78 (!) 57 62  Resp:  (!) 21 20 20   Temp:   98.1 F (36.7 C)   TempSrc:   Oral   SpO2:  94% 100% 99%  Weight: 70.5 kg     Height:        Intake/Output Summary (Last 24 hours) at 11/27/2019 1229 Last data filed at 11/27/2019 0351 Gross per 24 hour  Intake 449.48 ml  Output 650 ml  Net -200.52 ml   Filed Weights   11/24/19 0200 11/25/19 0419 11/27/19 0400  Weight: 70.7 kg 70.1 kg 70.5 kg    Examination: General exam: Appears comfortable  HEENT: PERRLA, oral mucosa moist, no sclera icterus or thrush Respiratory system: Clear to auscultation. Respiratory effort normal. Cardiovascular system: S1 & S2 heard,  No murmurs IIRR Gastrointestinal system: Abdomen soft, non-tender, nondistended. Normal bowel sounds   Central nervous system: Alert and oriented. No focal neurological deficits. Extremities: No cyanosis, clubbing or edema Skin: No rashes or ulcers Psychiatry:  Mood & affect appropriate.     Data Reviewed: I have personally reviewed following labs and imaging studies  CBC: Recent Labs  Lab 11/22/19 1814 11/24/19 0702  WBC 11.3* 10.4  HGB 11.1* 11.4*  HCT 35.2* 35.6*  MCV 88.9 89.0  PLT 369 326   Basic Metabolic Panel: Recent Labs  Lab 11/22/19 1814 11/24/19 0702 11/25/19 0545 11/26/19 0910  NA 137 137 137 137  K 3.8 3.8 4.2 4.4  CL 103 101 104 105  CO2 24 25 23  21*  GLUCOSE 180* 187* 284* 270*  BUN 14 13 21  37*  CREATININE 0.69 0.77 0.90 1.39*  CALCIUM 9.2 9.2 9.4 9.4   GFR: Estimated Creatinine Clearance: 27.7 mL/min (A) (by C-G formula based on SCr of 1.39 mg/dL (H)). Liver Function Tests: No results for input(s): AST, ALT, ALKPHOS, BILITOT, PROT, ALBUMIN in the last 168 hours. No results for input(s): LIPASE, AMYLASE in the last 168 hours. No  results for input(s): AMMONIA in the last 168 hours. Coagulation Profile: No results for input(s): INR, PROTIME in the last 168 hours. Cardiac Enzymes: No results for input(s): CKTOTAL, CKMB, CKMBINDEX, TROPONINI in the last 168 hours. BNP (last 3 results) No results for input(s): PROBNP in the last 8760 hours. HbA1C: No results for input(s): HGBA1C in the last 72 hours. CBG: Recent Labs  Lab 11/26/19 0559 11/26/19 1258 11/26/19 1708 11/26/19 2129 11/27/19 0637  GLUCAP 323* 234* 225* 209* 206*   Lipid Profile: Recent Labs    11/25/19 0545  CHOL 137  HDL 39*  LDLCALC 83  TRIG 74  CHOLHDL 3.5   Thyroid Function Tests: No results for input(s): TSH, T4TOTAL, FREET4, T3FREE, THYROIDAB in the last 72 hours. Anemia Panel: No results for input(s): VITAMINB12, FOLATE, FERRITIN, TIBC, IRON, RETICCTPCT in the last 72 hours. Urine analysis:    Component Value Date/Time   COLORURINE YELLOW 11/23/2019 0630   APPEARANCEUR HAZY (A) 11/23/2019 0630   LABSPEC 1.014 11/23/2019 0630   PHURINE 5.0 11/23/2019 0630   GLUCOSEU NEGATIVE 11/23/2019 0630   HGBUR NEGATIVE 11/23/2019 0630   BILIRUBINUR NEGATIVE 11/23/2019 0630   KETONESUR NEGATIVE 11/23/2019 0630   PROTEINUR NEGATIVE 11/23/2019 0630   NITRITE NEGATIVE 11/23/2019 0630   LEUKOCYTESUR NEGATIVE 11/23/2019 0630   Sepsis Labs: @LABRCNTIP (procalcitonin:4,lacticidven:4) ) Recent Results (from the past 240 hour(s))  SARS Coronavirus 2 by RT PCR (hospital order, performed in [facility] hospital lab) Nasopharyngeal Nasopharyngeal Swab     Status: None   Collection Time: 11/23/19  5:18 AM   Specimen: Nasopharyngeal Swab  Result Value Ref Range Status   SARS Coronavirus 2 NEGATIVE NEGATIVE Final    Comment: (NOTE) SARS-CoV-2 target nucleic acids are NOT DETECTED.  The SARS-CoV-2 RNA is generally detectable in upper and lower respiratory specimens during the acute phase of infection. The lowest concentration of SARS-CoV-2 viral  copies this assay can detect is 250 copies / mL. A negative result does not preclude SARS-CoV-2 infection and should not be used as the sole basis for treatment or other patient management decisions.  A negative result may occur with improper specimen collection / handling, submission of specimen other than nasopharyngeal swab, presence of viral mutation(s) within the areas targeted by this assay, and inadequate number of viral copies (<250 copies / mL). A negative result must be combined with clinical observations, patient history, and epidemiological information.  Fact Sheet for Patients:   11/25/19  Fact Sheet for Healthcare Providers: BoilerBrush.com.cy  This test is not yet approved or  cleared by the https://pope.com/ FDA and has been authorized for detection and/or diagnosis of SARS-CoV-2 by FDA under an Emergency Use Authorization (EUA).  This EUA will remain in effect (meaning this test can be used) for the duration of the COVID-19 declaration under Section 564(b)(1) of the Act, 21 U.S.C. section 360bbb-3(b)(1), unless the authorization is terminated or revoked sooner.  Performed at [facility], [address] [place], [place]   Culture, blood (routine x 2)     Status: None (Preliminary result)   Collection Time: 11/23/19  7:50 PM   Specimen: BLOOD LEFT HAND  Result Value Ref Range Status   Specimen Description BLOOD LEFT HAND  Final   Special Requests   Final    BOTTLES DRAWN AEROBIC ONLY Blood Culture adequate volume   Culture   Final    NO GROWTH 4 DAYS Performed at [facility], [address] [place], [place]    Report Status PENDING  Incomplete  Culture, blood (routine x 2)     Status: None (Preliminary result)   Collection Time: 11/23/19  7:51 PM   Specimen: BLOOD RIGHT ARM  Result Value Ref Range Status   Specimen Description BLOOD RIGHT ARM  Final   Special Requests    Final    BOTTLES DRAWN AEROBIC ONLY Blood Culture adequate volume   Culture   Final    NO GROWTH 4 DAYS Performed at [facility], [address] [place], [place]    Report Status PENDING  Incomplete  Radiology Studies: DG CHEST PORT 1 VIEW  Result Date: 11/27/2019 CLINICAL DATA:  Shortness of breath, unable to fully arose from sleep in order to obtain history. Unable to remove necklace. EXAM: PORTABLE CHEST 1 VIEW.  The patient is rotated. COMPARISON:  Chest x-ray 11/26/2019, CT chest 11/24/2019 FINDINGS: Similar-appearing enlarged cardiac silhouette. Otherwise the mediastinal contours are unchanged. Aortic arch and descending thoracic aorta calcifications. Low lung volumes. Slight interval decrease in interstitial markings. Similar-appearing retrocardiac opacity. No new focal consolidation. No pulmonary edema. No definite right pleural effusion. Similar-appearing small left pleural effusion. The visualized skeletal structures demonstrate no acute findings. IMPRESSION: Similar-appearing retrocardiac opacity which may represent atelectasis versus infection/inflammation. Interval improvement of residual mild pulmonary edema. Similar-appearing small left pleural effusion. Interval resolution of right pleural effusion. Electronically Signed   By: Tish FredericksonMorgane  Naveau M.D.   On: 11/27/2019 09:20   DG CHEST PORT 1 VIEW  Result Date: 11/26/2019 CLINICAL DATA:  Dyspnea, rapid response EXAM: PORTABLE CHEST 1 VIEW COMPARISON:  11/24/2019, 11/22/2019 FINDINGS: Single frontal view of the chest demonstrates a stable cardiac silhouette. There is increased central vascular congestion and interstitial prominence, with progressive left basilar consolidation. Small bilateral pleural effusions are noted. No pneumothorax. No acute bony abnormalities. IMPRESSION: 1. Worsening volume status, with increased interstitial edema and small bilateral pleural effusions. 2. Increasing left basilar  consolidation may reflect atelectasis. Electronically Signed   By: Sharlet SalinaMichael  Brown M.D.   On: 11/26/2019 03:01      Scheduled Meds: . albuterol  2.5 mg Nebulization BID  . amiodarone  200 mg Oral BID  . aspirin  81 mg Oral Daily  . atorvastatin  10 mg Oral Daily  . docusate sodium  100 mg Oral QODAY  . doxycycline  100 mg Oral Q12H  . DULoxetine  60 mg Oral Daily  . gabapentin  100 mg Oral TID  . insulin aspart  0-6 Units Subcutaneous TID WC  . insulin glargine  10 Units Subcutaneous Daily  . LORazepam  1 mg Intravenous Once  . losartan  25 mg Oral Daily  . meclizine  12.5 mg Oral Daily  . methylPREDNISolone (SOLU-MEDROL) injection  40 mg Intravenous Q24H  . multivitamin with minerals  1 tablet Oral q morning - 10a  . pantoprazole  40 mg Oral q morning - 10a  . rivaroxaban  15 mg Oral Daily  . sodium chloride flush  3 mL Intravenous Q12H  . tamsulosin  0.4 mg Oral q morning - 10a   Continuous Infusions:    LOS: 4 days      Calvert CantorSaima Dorissa Stinnette, MD [facility] Pager: [email or web address] 11/27/2019, 12:29 PM

## 2019-11-27 NOTE — NC FL2 (Signed)
[place] MEDICAID FL2 LEVEL OF CARE SCREENING TOOL     IDENTIFICATION  Patient Name: Lindsey [name] Birthdate: 01-Aug-1930 Sex: female Admission Date (Current Location): 11/22/2019  [facility] and [place] Number:  Producer, television/film/video and Address:  [facility] [place], [address], [place], [place] [address]      Provider Number: [id number]  Attending Physician Name and Address:  Calvert Cantor, MD  Relative Name and Phone Number:  Mason Jim [phone]    Current Level of Care: Hospital Recommended Level of Care: Assisted Living Facility Prior Approval Number:    Date Approved/Denied:   PASRR Number:    Discharge Plan: Other (Comment) (ALF)    Current Diagnoses: Patient Active Problem List   Diagnosis Date Noted  . A-fib (HCC) 11/27/2019  . Palliative care by specialist   . Goals of care, counseling/discussion   . Terminal care   . Sepsis (HCC)   . COPD with acute exacerbation (HCC) 11/25/2019  . Chest pain 11/23/2019  . Acute CHF (congestive heart failure) (HCC) 11/23/2019  . Unspecified atrial fibrillation (HCC) 11/23/2019  . Chronic anticoagulation 11/23/2019  . Acute lower UTI 11/23/2019  . DNR (do not resuscitate) 11/23/2019    Orientation RESPIRATION BLADDER Height & Weight     Self, Situation, Place  O2 (Nasal Canula) Continent, External catheter Weight: 155 lb 8 oz (70.5 kg) Height:  5\' 7"  (170.2 cm)  BEHAVIORAL SYMPTOMS/MOOD NEUROLOGICAL BOWEL NUTRITION STATUS      Continent Diet (Cardiac/Carb Modified)  AMBULATORY STATUS COMMUNICATION OF NEEDS Skin   Limited Assist Verbally Normal                       Personal Care Assistance Level of Assistance  Bathing, Feeding, Dressing Bathing Assistance: Limited assistance Feeding assistance: Independent Dressing Assistance: Limited assistance     Functional Limitations Info  Sight, Hearing, Speech Sight Info: Adequate Hearing Info: Impaired Speech Info: Adequate     SPECIAL CARE FACTORS FREQUENCY  PT (By licensed PT), OT (By licensed OT)     PT Frequency: 5x week OT Frequency: 5x week            Contractures Contractures Info: Not present    Additional Factors Info  Code Status, Allergies, Psychotropic, Insulin Sliding Scale     Psychotropic Info: DULoxetine (CYMBALTA) DR capsule 60 mg daily, gabapentin (NEURONTIN) capsule 100 mg 3x daily, LORazepam (ATIVAN) injection 1 mg Insulin Sliding Scale Info: insulin aspart (novoLOG) injection 0-6 Units 3x daily, insulin glargine (LANTUS) injection 10 Units daily       Current Medications (11/27/2019):  This is the current hospital active medication list Current Facility-Administered Medications  Medication Dose Route Frequency Provider Last Rate Last Admin  . acetaminophen (TYLENOL) tablet 650 mg  650 mg Oral Q6H PRN 01/27/2020 A, MD   650 mg at 11/26/19 0427   Or  . acetaminophen (TYLENOL) suppository 650 mg  650 mg Rectal Q6H PRN 01/26/20 A, MD      . albuterol (PROVENTIL) (2.5 MG/3ML) 0.083% nebulizer solution 2.5 mg  2.5 mg Nebulization Q2H PRN Madelyn Flavors, MD   2.5 mg at 11/26/19 2323  . albuterol (PROVENTIL) (2.5 MG/3ML) 0.083% nebulizer solution 2.5 mg  2.5 mg Nebulization BID 2324, MD   2.5 mg at 11/27/19 0746  . amiodarone (PACERONE) tablet 200 mg  200 mg Oral BID 01/27/20, PA-C      . diclofenac Sodium (VOLTAREN) 1 % topical gel  2 g  2 g Topical Daily PRN Madelyn Flavors A, MD   2 g at 11/23/19 2127  . docusate sodium (COLACE) capsule 100 mg  100 mg Oral QODAY Smith, Rondell A, MD   100 mg at 11/27/19 0920  . doxycycline (VIBRA-TABS) tablet 100 mg  100 mg Oral Q12H Calvert Cantor, MD   100 mg at 11/27/19 0920  . DULoxetine (CYMBALTA) DR capsule 60 mg  60 mg Oral Daily Madelyn Flavors A, MD   60 mg at 11/27/19 0919  . gabapentin (NEURONTIN) capsule 100 mg  100 mg Oral TID Madelyn Flavors A, MD   100 mg at 11/27/19 0919  . hydrALAZINE (APRESOLINE)  injection 10 mg  10 mg Intravenous Q4H PRN Smith, Rondell A, MD      . insulin aspart (novoLOG) injection 0-6 Units  0-6 Units Subcutaneous TID WC Madelyn Flavors A, MD   2 Units at 11/27/19 0730  . insulin glargine (LANTUS) injection 10 Units  10 Units Subcutaneous Daily Calvert Cantor, MD   10 Units at 11/27/19 0934  . ipratropium-albuterol (DUONEB) 0.5-2.5 (3) MG/3ML nebulizer solution 3 mL  3 mL Nebulization Q6H PRN Zierle-Ghosh, Asia B, DO      . LORazepam (ATIVAN) 2 MG/ML concentrated solution 1 mg  1 mg Oral Q3H PRN Ernie Avena, NP      . LORazepam (ATIVAN) injection 1 mg  1 mg Intravenous Once Zierle-Ghosh, Asia B, DO      . losartan (COZAAR) tablet 25 mg  25 mg Oral Daily Furth, Cadence H, PA-C   25 mg at 11/27/19 0919  . meclizine (ANTIVERT) tablet 12.5 mg  12.5 mg Oral Daily Katrinka Blazing, Rondell A, MD   12.5 mg at 11/27/19 0920  . meclizine (ANTIVERT) tablet 12.5 mg  12.5 mg Oral QHS PRN Madelyn Flavors A, MD      . morphine 10 MG/5ML solution 2.5 mg  2.5 mg Oral Q3H PRN Ernie Avena, NP      . multivitamin with minerals tablet 1 tablet  1 tablet Oral q morning - 10a Clydie Braun, MD   1 tablet at 11/27/19 0919  . ondansetron (ZOFRAN) tablet 4 mg  4 mg Oral Q6H PRN Madelyn Flavors A, MD       Or  . ondansetron (ZOFRAN) injection 4 mg  4 mg Intravenous Q6H PRN Smith, Rondell A, MD      . pantoprazole (PROTONIX) EC tablet 40 mg  40 mg Oral q morning - 10a Madelyn Flavors A, MD   40 mg at 11/27/19 0920  . Rivaroxaban (XARELTO) tablet 15 mg  15 mg Oral Daily Calvert Cantor, MD   15 mg at 11/27/19 0921  . SUMAtriptan (IMITREX) tablet 50 mg  50 mg Oral Q2H PRN Smith, Rondell A, MD      . tamsulosin (FLOMAX) capsule 0.4 mg  0.4 mg Oral q morning - 10a Smith, Rondell A, MD   0.4 mg at 11/27/19 0919     Discharge Medications: amiodarone 200 MG tablet Commonly known as: PACERONE Take 1 tablet (200 mg total) by mouth daily.   aspirin 81 MG chewable tablet Chew 81 mg by mouth  every morning.   atorvastatin 10 MG tablet Commonly known as: LIPITOR Take 10 mg by mouth every morning.   diclofenac Sodium 1 % Gel Commonly known as: VOLTAREN Apply 2 g topically daily as needed (pain).   docusate sodium 100 MG capsule Commonly known as: COLACE Take 100 mg by mouth every other  day.   DULoxetine 60 MG capsule Commonly known as: CYMBALTA Take 60 mg by mouth every morning.   furosemide 40 MG tablet Commonly known as: LASIX Take 40 mg by mouth every morning.   gabapentin 100 MG capsule Commonly known as: NEURONTIN Take 100 mg by mouth 3 (three) times daily. 10a, 4p, 10p   meclizine 12.5 MG tablet Commonly known as: ANTIVERT Take 12.5 mg by mouth See admin instructions. Take one tablet (12.5 mg) by mouth daily at 8am, may also take one tablet (12.5 mg) every 12 hours as needed for dizziness - give 2nd dose at least 4 hours are morning scheduled dose   multivitamin with minerals Tabs tablet Take 1 tablet by mouth every morning.   pantoprazole 40 MG tablet Commonly known as: PROTONIX Take 40 mg by mouth every morning.   potassium chloride 10 MEQ tablet Commonly known as: KLOR-CON Take 10 mEq by mouth every Monday, Wednesday, and Friday.   rivaroxaban 20 MG Tabs tablet Commonly known as: XARELTO Take 20 mg by mouth every morning.   SUMAtriptan 50 MG tablet Commonly known as: IMITREX Take 50 mg by mouth every 2 (two) hours as needed for migraine.   tamsulosin 0.4 MG Caps capsule Commonly known as: FLOMAX Take 0.4 mg by mouth every morning.   tiZANidine 2 MG tablet Commonly known as: ZANAFLEX Take 2 mg by mouth every morning.     Relevant Imaging Results:  Relevant Lab Results:   Additional Information ssn [id number]  Carmina Miller, LCSWA

## 2019-11-27 NOTE — NC FL2 (Deleted)
[place] MEDICAID FL2 LEVEL OF CARE SCREENING TOOL     IDENTIFICATION  Patient Name: Lindsey [name] Birthdate: [birth year] Sex: female Admission Date (Current Location): 11/22/2019  [facility] and [place] Number:  Producer, television/film/video and Address:  [facility] [place], [address] [place], [place] [address]      Provider Number: [id number]  Attending Physician Name and Address:  Calvert Cantor, MD  Relative Name and Phone Number:  Mason Jim [phone]    Current Level of Care: Hospital Recommended Level of Care: Assisted Living Facility Prior Approval Number:    Date Approved/Denied:   PASRR Number:    Discharge Plan: Other (Comment) (ALF)    Current Diagnoses: Patient Active Problem List   Diagnosis Date Noted  . A-fib (HCC) 11/27/2019  . Sepsis (HCC)   . COPD with acute exacerbation (HCC) 11/25/2019  . Chest pain 11/23/2019  . Acute CHF (congestive heart failure) (HCC) 11/23/2019  . Unspecified atrial fibrillation (HCC) 11/23/2019  . Chronic anticoagulation 11/23/2019  . Acute lower UTI 11/23/2019  . DNR (do not resuscitate) 11/23/2019    Orientation RESPIRATION BLADDER Height & Weight     Self, Situation, Place  O2 (Nasal Canula) Continent, External catheter Weight: 155 lb 8 oz (70.5 kg) Height:  5\' 7"  (170.2 cm)  BEHAVIORAL SYMPTOMS/MOOD NEUROLOGICAL BOWEL NUTRITION STATUS      Continent Diet (Cardiac/Carb Modified)  AMBULATORY STATUS COMMUNICATION OF NEEDS Skin   Limited Assist Verbally Normal                       Personal Care Assistance Level of Assistance  Bathing, Feeding, Dressing Bathing Assistance: Limited assistance Feeding assistance: Independent Dressing Assistance: Limited assistance     Functional Limitations Info  Sight, Hearing, Speech Sight Info: Adequate Hearing Info: Impaired Speech Info: Adequate    SPECIAL CARE FACTORS FREQUENCY  PT (By licensed PT), OT (By licensed OT)     PT Frequency: 5x  week OT Frequency: 5x week            Contractures Contractures Info: Not present    Additional Factors Info  Code Status, Allergies, Psychotropic, Insulin Sliding Scale     Psychotropic Info: DULoxetine (CYMBALTA) DR capsule 60 mg daily, gabapentin (NEURONTIN) capsule 100 mg 3x daily, LORazepam (ATIVAN) injection 1 mg Insulin Sliding Scale Info: insulin aspart (novoLOG) injection 0-6 Units 3x daily, insulin glargine (LANTUS) injection 10 Units daily       Current Medications (11/27/2019):  This is the current hospital active medication list Current Facility-Administered Medications  Medication Dose Route Frequency Provider Last Rate Last Admin  . acetaminophen (TYLENOL) tablet 650 mg  650 mg Oral Q6H PRN 01/27/2020 A, MD   650 mg at 11/26/19 0427   Or  . acetaminophen (TYLENOL) suppository 650 mg  650 mg Rectal Q6H PRN 01/26/20 A, MD      . albuterol (PROVENTIL) (2.5 MG/3ML) 0.083% nebulizer solution 2.5 mg  2.5 mg Nebulization Q2H PRN Madelyn Flavors, MD   2.5 mg at 11/26/19 2323  . albuterol (PROVENTIL) (2.5 MG/3ML) 0.083% nebulizer solution 2.5 mg  2.5 mg Nebulization BID 2324, MD   2.5 mg at 11/27/19 0746  . amiodarone (PACERONE) tablet 200 mg  200 mg Oral BID 01/27/20, PA-C      . diclofenac Sodium (VOLTAREN) 1 % topical gel 2 g  2 g Topical Daily PRN Graciella Freer A, MD   2 g at 11/23/19 2127  .  docusate sodium (COLACE) capsule 100 mg  100 mg Oral QODAY Smith, Rondell A, MD   100 mg at 11/27/19 0920  . doxycycline (VIBRA-TABS) tablet 100 mg  100 mg Oral Q12H Calvert Cantor, MD   100 mg at 11/27/19 0920  . DULoxetine (CYMBALTA) DR capsule 60 mg  60 mg Oral Daily Madelyn Flavors A, MD   60 mg at 11/27/19 0919  . gabapentin (NEURONTIN) capsule 100 mg  100 mg Oral TID Madelyn Flavors A, MD   100 mg at 11/27/19 0919  . hydrALAZINE (APRESOLINE) injection 10 mg  10 mg Intravenous Q4H PRN Smith, Rondell A, MD      . insulin aspart (novoLOG)  injection 0-6 Units  0-6 Units Subcutaneous TID WC Madelyn Flavors A, MD   2 Units at 11/27/19 0730  . insulin glargine (LANTUS) injection 10 Units  10 Units Subcutaneous Daily Calvert Cantor, MD   10 Units at 11/27/19 0934  . ipratropium-albuterol (DUONEB) 0.5-2.5 (3) MG/3ML nebulizer solution 3 mL  3 mL Nebulization Q6H PRN Zierle-Ghosh, Asia B, DO      . LORazepam (ATIVAN) injection 1 mg  1 mg Intravenous Once Zierle-Ghosh, Asia B, DO      . losartan (COZAAR) tablet 25 mg  25 mg Oral Daily Furth, Cadence H, PA-C   25 mg at 11/27/19 0919  . meclizine (ANTIVERT) tablet 12.5 mg  12.5 mg Oral Daily Katrinka Blazing, Rondell A, MD   12.5 mg at 11/27/19 0920  . meclizine (ANTIVERT) tablet 12.5 mg  12.5 mg Oral QHS PRN Madelyn Flavors A, MD      . multivitamin with minerals tablet 1 tablet  1 tablet Oral q morning - 10a Clydie Braun, MD   1 tablet at 11/27/19 0919  . ondansetron (ZOFRAN) tablet 4 mg  4 mg Oral Q6H PRN Madelyn Flavors A, MD       Or  . ondansetron (ZOFRAN) injection 4 mg  4 mg Intravenous Q6H PRN Smith, Rondell A, MD      . pantoprazole (PROTONIX) EC tablet 40 mg  40 mg Oral q morning - 10a Madelyn Flavors A, MD   40 mg at 11/27/19 0920  . Rivaroxaban (XARELTO) tablet 15 mg  15 mg Oral Daily Calvert Cantor, MD   15 mg at 11/27/19 0921  . SUMAtriptan (IMITREX) tablet 50 mg  50 mg Oral Q2H PRN Smith, Rondell A, MD      . tamsulosin (FLOMAX) capsule 0.4 mg  0.4 mg Oral q morning - 10a Smith, Rondell A, MD   0.4 mg at 11/27/19 0919     Discharge Medications:  amiodarone 200 MG tablet Commonly known as: PACERONE Take 1 tablet (200 mg total) by mouth daily.   aspirin 81 MG chewable tablet Chew 81 mg by mouth every morning.   atorvastatin 10 MG tablet Commonly known as: LIPITOR Take 10 mg by mouth every morning.   diclofenac Sodium 1 % Gel Commonly known as: VOLTAREN Apply 2 g topically daily as needed (pain).   docusate sodium 100 MG capsule Commonly known as: COLACE Take 100 mg by  mouth every other day.   DULoxetine 60 MG capsule Commonly known as: CYMBALTA Take 60 mg by mouth every morning.   furosemide 40 MG tablet Commonly known as: LASIX Take 40 mg by mouth every morning.   gabapentin 100 MG capsule Commonly known as: NEURONTIN Take 100 mg by mouth 3 (three) times daily. 10a, 4p, 10p   meclizine 12.5 MG tablet Commonly known as:  ANTIVERT Take 12.5 mg by mouth See admin instructions. Take one tablet (12.5 mg) by mouth daily at 8am, may also take one tablet (12.5 mg) every 12 hours as needed for dizziness - give 2nd dose at least 4 hours are morning scheduled dose   multivitamin with minerals Tabs tablet Take 1 tablet by mouth every morning.   pantoprazole 40 MG tablet Commonly known as: PROTONIX Take 40 mg by mouth every morning.   potassium chloride 10 MEQ tablet Commonly known as: KLOR-CON Take 10 mEq by mouth every Monday, Wednesday, and Friday.   rivaroxaban 20 MG Tabs tablet Commonly known as: XARELTO Take 20 mg by mouth every morning.   SUMAtriptan 50 MG tablet Commonly known as: IMITREX Take 50 mg by mouth every 2 (two) hours as needed for migraine.   tamsulosin 0.4 MG Caps capsule Commonly known as: FLOMAX Take 0.4 mg by mouth every morning.   tiZANidine 2 MG tablet Commonly known as: ZANAFLEX Take 2 mg by mouth every morning.     Relevant Imaging Results:  Relevant Lab Results:   Additional Information ssn [id number]  Carmina Miller, LCSWA

## 2019-11-27 NOTE — Progress Notes (Signed)
Cardiology Progress Note  Patient ID: Lindsey [name] MRN: [id number] DOB: 10-20-1930 Date of Encounter: 11/27/2019  Primary Cardiologist: No primary care provider on file.  Subjective   Chief Complaint: Shortness of breath  HPI: Short of breath this morning.  Heart rates have been in the 40-50 range.  She received 200 mg of metoprolol succinate yesterday, diltiazem 180 mg and by my calculation 420 mg of amiodarone.  Heart rate remains low.  ROS:  All other ROS reviewed and negative. Pertinent positives noted in the HPI.     Inpatient Medications  Scheduled Meds: . albuterol  2.5 mg Nebulization BID  . aspirin  81 mg Oral Daily  . atorvastatin  10 mg Oral Daily  . docusate sodium  100 mg Oral QODAY  . doxycycline  100 mg Oral Q12H  . DULoxetine  60 mg Oral Daily  . gabapentin  100 mg Oral TID  . insulin aspart  0-6 Units Subcutaneous TID WC  . insulin glargine  10 Units Subcutaneous Daily  . LORazepam  1 mg Intravenous Once  . losartan  25 mg Oral Daily  . meclizine  12.5 mg Oral Daily  . methylPREDNISolone (SOLU-MEDROL) injection  40 mg Intravenous Q24H  . multivitamin with minerals  1 tablet Oral q morning - 10a  . pantoprazole  40 mg Oral q morning - 10a  . rivaroxaban  15 mg Oral Daily  . sodium chloride flush  3 mL Intravenous Q12H  . tamsulosin  0.4 mg Oral q morning - 10a   Continuous Infusions:  PRN Meds: acetaminophen **OR** acetaminophen, albuterol, diclofenac Sodium, hydrALAZINE, ipratropium-albuterol, meclizine, ondansetron **OR** ondansetron (ZOFRAN) IV, SUMAtriptan   Vital Signs   Vitals:   11/26/19 2324 11/27/19 0321 11/27/19 0400 11/27/19 0747  BP:  (!) 92/41    Pulse:  (!) 56  78  Resp:  19  (!) 21  Temp:  (!) 97.5 F (36.4 C)    TempSrc:  Oral    SpO2: 98% 90%  94%  Weight:   70.5 kg   Height:        Intake/Output Summary (Last 24 hours) at 11/27/2019 0843 Last data filed at 11/27/2019 0351 Gross per 24 hour  Intake 479.48 ml  Output 1250 ml    Net -770.52 ml   Last 3 Weights 11/27/2019 11/25/2019 11/24/2019  Weight (lbs) 155 lb 8 oz 154 lb 8.7 oz 155 lb 13.8 oz  Weight (kg) 70.534 kg 70.1 kg 70.7 kg      Telemetry  Overnight telemetry shows A. fib with SVR with heart rate in the 40-50 bpm range, which I personally reviewed.   ECG  The most recent ECG shows A. fib with RVR, 111 bpm, which I personally reviewed.   Physical Exam   Vitals:   11/26/19 2324 11/27/19 0321 11/27/19 0400 11/27/19 0747  BP:  (!) 92/41    Pulse:  (!) 56  78  Resp:  19  (!) 21  Temp:  (!) 97.5 F (36.4 C)    TempSrc:  Oral    SpO2: 98% 90%  94%  Weight:   70.5 kg   Height:         Intake/Output Summary (Last 24 hours) at 11/27/2019 0843 Last data filed at 11/27/2019 0351 Gross per 24 hour  Intake 479.48 ml  Output 1250 ml  Net -770.52 ml    Last 3 Weights 11/27/2019 11/25/2019 11/24/2019  Weight (lbs) 155 lb 8 oz 154 lb 8.7 oz 155 lb 13.8 oz  Weight (kg) 70.534 kg 70.1 kg 70.7 kg    Body mass index is 24.35 kg/m.   General: Ill-appearing, tachypnea noted Head: Atraumatic, normal size  Eyes: PEERLA, EOMI  Neck: Supple, JVD 7-8 cmH2O Endocrine: No thryomegaly Cardiac: Normal S1, S2; irregular rhythm, bradycardia noted Lungs: Diminished breath sounds bilaterally, Rales improved Abd: Soft, nontender, no hepatomegaly  Ext: No edema, pulses 2+ Musculoskeletal: No deformities, BUE and BLE strength normal and equal Skin: Warm and dry, no rashes   Neuro: Alert, awake, oriented to person only  Labs  High Sensitivity Troponin:   Recent Labs  Lab 11/22/19 1814 11/23/19 0210 11/26/19 0700 11/26/19 0910  TROPONINIHS 41* 45* 16 36*     Cardiac EnzymesNo results for input(s): TROPONINI in the last 168 hours. No results for input(s): TROPIPOC in the last 168 hours.  Chemistry Recent Labs  Lab 11/24/19 0702 11/25/19 0545 11/26/19 0910  NA 137 137 137  K 3.8 4.2 4.4  CL 101 104 105  CO2 25 23 21*  GLUCOSE 187* 284* 270*  BUN 13 21 37*   CREATININE 0.77 0.90 1.39*  CALCIUM 9.2 9.4 9.4  GFRNONAA >60 57* 34*  GFRAA >60 >60 39*  ANIONGAP 11 10 11     Hematology Recent Labs  Lab 11/22/19 1814 11/24/19 0702  WBC 11.3* 10.4  RBC 3.96 4.00  HGB 11.1* 11.4*  HCT 35.2* 35.6*  MCV 88.9 89.0  MCH 28.0 28.5  MCHC 31.5 32.0  RDW 14.0 13.8  PLT 369 326   BNP Recent Labs  Lab 11/23/19 0518  BNP 687.3*    DDimer  Recent Labs  Lab 11/23/19 0518  DDIMER 0.57*     Radiology  DG CHEST PORT 1 VIEW  Result Date: 11/26/2019 CLINICAL DATA:  Dyspnea, rapid response EXAM: PORTABLE CHEST 1 VIEW COMPARISON:  11/24/2019, 11/22/2019 FINDINGS: Single frontal view of the chest demonstrates a stable cardiac silhouette. There is increased central vascular congestion and interstitial prominence, with progressive left basilar consolidation. Small bilateral pleural effusions are noted. No pneumothorax. No acute bony abnormalities. IMPRESSION: 1. Worsening volume status, with increased interstitial edema and small bilateral pleural effusions. 2. Increasing left basilar consolidation may reflect atelectasis. Electronically Signed   By: 11/24/2019 M.D.   On: 11/26/2019 03:01    Cardiac Studies  TTE 11/23/2019 1. Left ventricular ejection fraction, by estimation, is 40 to 45%. The  left ventricle has mildly decreased function. The left ventricle  demonstrates regional wall motion abnormalities (see scoring  diagram/findings for description). There is moderate  asymmetric left ventricular hypertrophy of the basal-septal segment. Left  ventricular diastolic function could not be evaluated. There is severe  hypokinesis of the left ventricular, entire septal wall, apical segment  and inferoseptal wall.  2. Right ventricular systolic function is mildly reduced. The right  ventricular size is normal.  3. Left atrial size was severely dilated.  4. The mitral valve is abnormal. Mild mitral valve regurgitation.  5. The aortic valve is  tricuspid. Aortic valve regurgitation is mild.  Mild aortic valve sclerosis is present, with no evidence of aortic valve  stenosis.   Patient Profile  Kasumi Ditullio a 86 y.o.femalewith hypertension, CAD status post PCI paroxysmal atrial fibrillation on Xarelto, diabetes who was admitted on 11/22/2019 for acute decompensated systolic heart failure complicated by atrial fibrillation with RVR.  Assessment & Plan   1.  Acute systolic heart failure, EF 40-45%, A. fib with RVR -She is had good diuresis.  Most recent chest x-ray with volume  overload.  Her heart rate is in SVR today.  Her JVD appears much improved today.  I will recheck a chest x-ray. -We will continue with diuresis pending kidney panel today. -Holding metoprolol succinate due to bradycardia. -Can hold losartan given hypotension. -Overall thought is that her cardiomyopathy is A. fib related.  She is had very difficult to control rates.  Now with bradycardia.  See discussion below.  2.  A. fib with RVR now with SVR -She was having difficult to control rates.  Received 200 mg metoprolol succinate yesterday.  She did get a total of 180 mg of diltiazem yesterday as well.  We did add amiodarone and were planning for an attempt at rhythm control with cardioversion today.  She is now in SVR.  She appears a bit more confused today.  Hold on cardioversion today. -I have asked electrophysiology to evaluate her for possible pacemaker.  She may be more of a tacky bradycardia.  Given that we have been difficult to control high heart rates and now slow heart rates pacemaker may be indicated.  We will await their evaluation. -Continue Xarelto for now. -I did update the daughter of the plan today. -Holding all AV nodal agents for now.  3.  CAD -Daughter reports PCI 5 years ago in [place]. -Cardiac enzymes negative. -Echo with abnormal septal motion could be old infarct. -Continue aspirin statin. -She is DNR/DNI not wanting aggressive  measures.  For questions or updates, please contact [facility] Please consult [email or web address] for contact info under   Time Spent with Patient: I have spent a total of 25 minutes with patient reviewing hospital notes, telemetry, EKGs, labs and examining the patient as well as establishing an assessment and plan that was discussed with the patient.  > 50% of time was spent in direct patient care.    Signed, Lenna Gilford. Flora Lipps, MD [facility]  [facility]  11/27/2019 8:43 AM

## 2019-11-27 NOTE — Progress Notes (Signed)
°   11/27/19 1601  Clinical Encounter Type  Visited With Patient and family together  Visit Type Initial;Spiritual support  Referral From Palliative care team  Consult/Referral To Chaplain  Spiritual Encounters  Spiritual Needs Prayer  This chaplain responded to PMT consult for EOL spiritual care.  The chaplain checked in with the Pt. RN-Geraldine before the visit. The Pt. is joined by multiple generations of family as the Pt. waits for transition to [place].  The family shared their mother's tenacity in her faith and how her faith influenced the people in the room. The family accepted the invitation for prayer and F/U spiritual care as needed.

## 2019-11-27 NOTE — NC FL2 (Signed)
[place] MEDICAID FL2 LEVEL OF CARE SCREENING TOOL     IDENTIFICATION  Patient Name: Lindsey [name] Birthdate: 01-Aug-1930 Sex: female Admission Date (Current Location): 11/22/2019  [facility] and [place] Number:  Producer, television/film/video and Address:  [facility] [place]. [facility], [address], [place], [place] [address]      Provider Number: [id number]  Attending Physician Name and Address:  Calvert Cantor, MD  Relative Name and Phone Number:  Mason Jim [phone]    Current Level of Care: Hospital Recommended Level of Care: Assisted Living Facility Prior Approval Number:    Date Approved/Denied:   PASRR Number:    Discharge Plan: Other (Comment) (ALF)    Current Diagnoses: Patient Active Problem List   Diagnosis Date Noted  . A-fib (HCC) 11/27/2019  . Palliative care by specialist   . Goals of care, counseling/discussion   . Terminal care   . Sepsis (HCC)   . COPD with acute exacerbation (HCC) 11/25/2019  . Chest pain 11/23/2019  . Acute CHF (congestive heart failure) (HCC) 11/23/2019  . Unspecified atrial fibrillation (HCC) 11/23/2019  . Chronic anticoagulation 11/23/2019  . Acute lower UTI 11/23/2019  . DNR (do not resuscitate) 11/23/2019    Orientation RESPIRATION BLADDER Height & Weight     Self, Situation, Place  O2 (Nasal Canula) Continent, External catheter Weight: 155 lb 8 oz (70.5 kg) Height:  5\' 7"  (170.2 cm)  BEHAVIORAL SYMPTOMS/MOOD NEUROLOGICAL BOWEL NUTRITION STATUS      Continent Diet (Cardiac/Carb Modified)  AMBULATORY STATUS COMMUNICATION OF NEEDS Skin   Limited Assist Verbally Normal                       Personal Care Assistance Level of Assistance  Bathing, Feeding, Dressing Bathing Assistance: Limited assistance Feeding assistance: Independent Dressing Assistance: Limited assistance     Functional Limitations Info  Sight, Hearing, Speech Sight Info: Adequate Hearing Info: Impaired Speech Info: Adequate     SPECIAL CARE FACTORS FREQUENCY  PT (By licensed PT), OT (By licensed OT)     PT Frequency: 5x week OT Frequency: 5x week            Contractures Contractures Info: Not present    Additional Factors Info  Code Status, Allergies, Psychotropic, Insulin Sliding Scale     Psychotropic Info: DULoxetine (CYMBALTA) DR capsule 60 mg daily, gabapentin (NEURONTIN) capsule 100 mg 3x daily, LORazepam (ATIVAN) injection 1 mg Insulin Sliding Scale Info: insulin aspart (novoLOG) injection 0-6 Units 3x daily, insulin glargine (LANTUS) injection 10 Units daily       Current Medications (11/27/2019):  This is the current hospital active medication list Current Facility-Administered Medications  Medication Dose Route Frequency Provider Last Rate Last Admin  . acetaminophen (TYLENOL) tablet 650 mg  650 mg Oral Q6H PRN 01/27/2020 A, MD   650 mg at 11/26/19 0427   Or  . acetaminophen (TYLENOL) suppository 650 mg  650 mg Rectal Q6H PRN 01/26/20 A, MD      . albuterol (PROVENTIL) (2.5 MG/3ML) 0.083% nebulizer solution 2.5 mg  2.5 mg Nebulization Q2H PRN Madelyn Flavors, MD   2.5 mg at 11/26/19 2323  . albuterol (PROVENTIL) (2.5 MG/3ML) 0.083% nebulizer solution 2.5 mg  2.5 mg Nebulization BID 2324, MD   2.5 mg at 11/27/19 0746  . amiodarone (PACERONE) tablet 200 mg  200 mg Oral BID 01/27/20, PA-C      . diclofenac Sodium (VOLTAREN) 1 % topical gel  2 g  2 g Topical Daily PRN Madelyn Flavors A, MD   2 g at 11/23/19 2127  . docusate sodium (COLACE) capsule 100 mg  100 mg Oral QODAY Smith, Rondell A, MD   100 mg at 11/27/19 0920  . doxycycline (VIBRA-TABS) tablet 100 mg  100 mg Oral Q12H Calvert Cantor, MD   100 mg at 11/27/19 0920  . DULoxetine (CYMBALTA) DR capsule 60 mg  60 mg Oral Daily Madelyn Flavors A, MD   60 mg at 11/27/19 0919  . gabapentin (NEURONTIN) capsule 100 mg  100 mg Oral TID Madelyn Flavors A, MD   100 mg at 11/27/19 0919  . hydrALAZINE (APRESOLINE)  injection 10 mg  10 mg Intravenous Q4H PRN Smith, Rondell A, MD      . insulin aspart (novoLOG) injection 0-6 Units  0-6 Units Subcutaneous TID WC Madelyn Flavors A, MD   2 Units at 11/27/19 0730  . insulin glargine (LANTUS) injection 10 Units  10 Units Subcutaneous Daily Calvert Cantor, MD   10 Units at 11/27/19 0934  . ipratropium-albuterol (DUONEB) 0.5-2.5 (3) MG/3ML nebulizer solution 3 mL  3 mL Nebulization Q6H PRN Zierle-Ghosh, Asia B, DO      . LORazepam (ATIVAN) 2 MG/ML concentrated solution 1 mg  1 mg Oral Q3H PRN Ernie Avena, NP      . LORazepam (ATIVAN) injection 1 mg  1 mg Intravenous Once Zierle-Ghosh, Asia B, DO      . losartan (COZAAR) tablet 25 mg  25 mg Oral Daily Furth, Cadence H, PA-C   25 mg at 11/27/19 0919  . meclizine (ANTIVERT) tablet 12.5 mg  12.5 mg Oral Daily Katrinka Blazing, Rondell A, MD   12.5 mg at 11/27/19 0920  . meclizine (ANTIVERT) tablet 12.5 mg  12.5 mg Oral QHS PRN Madelyn Flavors A, MD      . morphine 10 MG/5ML solution 2.5 mg  2.5 mg Oral Q3H PRN Ernie Avena, NP      . multivitamin with minerals tablet 1 tablet  1 tablet Oral q morning - 10a Clydie Braun, MD   1 tablet at 11/27/19 0919  . ondansetron (ZOFRAN) tablet 4 mg  4 mg Oral Q6H PRN Madelyn Flavors A, MD       Or  . ondansetron (ZOFRAN) injection 4 mg  4 mg Intravenous Q6H PRN Smith, Rondell A, MD      . pantoprazole (PROTONIX) EC tablet 40 mg  40 mg Oral q morning - 10a Madelyn Flavors A, MD   40 mg at 11/27/19 0920  . Rivaroxaban (XARELTO) tablet 15 mg  15 mg Oral Daily Calvert Cantor, MD   15 mg at 11/27/19 0921  . SUMAtriptan (IMITREX) tablet 50 mg  50 mg Oral Q2H PRN Smith, Rondell A, MD      . tamsulosin (FLOMAX) capsule 0.4 mg  0.4 mg Oral q morning - 10a Madelyn Flavors A, MD   0.4 mg at 11/27/19 0919     Discharge Medications: Please see discharge summary for a list of discharge medications.  Relevant Imaging Results:  Relevant Lab Results:   Additional Information ssn  [id number]  Carmina Miller, LCSWA

## 2019-11-27 NOTE — Consult Note (Signed)
Palliative Medicine Inpatient Consult Note  Reason for consult:  Goals of Care "Failure to Thrive"  HPI:  Per intake H&P --> Lindsey [name] is a 86 y.o. female with medical history significant of hypertension, paroxysmal atrial fibrillation on Eliquis, diabetes mellitus type 2, presents with complaints of shortness of breath over the last week.  She denies having any cough, but reports symptoms seem worse when trying to lay down at night.   Palliative care was asked to aid in [place] conversations in the setting of severe dementia with ongoing FTT.  Clinical Assessment/Goals of Care: I have reviewed medical records including EPIC notes, labs and imaging, received report from bedside RN, assessed the patient who was lying in bed noted to have e/o abdominal breathing.    I met with Lindsey [name] and Lindsey [name] to further discuss diagnosis prognosis, GOC, EOL wishes, disposition and options.   I introduced Palliative Medicine as specialized medical care for people living with serious illness. It focuses on providing relief from the symptoms and stress of a serious illness. The goal is to improve quality of life for both the patient and the family.  I asked them to tell me about Lindsey [name]. They share that she is from [place] originally. She moved to [place] with her husband in 05-20-16 though he passed away shortly thereafter. They had been married for over 103 four years and share four daughters and one son. She is  A strong and faithful woman per her daughters. She has a cat who she adores and her per children would like to get back to.   Lindsey [name] lives at [facility]. She has been there since June of this year. Her daughters chose that location as they knew she would be there until "her end". They share that she has been ready to pass away since her husband died three years ago. Her dementia had worsened significantly since then. She now has ongoing failure to thrive. Her daughters emphasize  that at this point they would like for her comfort to be our main consideration.   Dr. Audie Box came in during our conversation to further discuss Lindsey [name]'s afib and the possible interventions for this. The patient has been clear about not wanting cardioversion or any additional interventions that are invasive.   A detailed discussion was had today regarding advanced directives, there are none on file though her daughters make decisions as a family.    Concepts specific to code status, artifical feeding and hydration, continued IV antibiotics and rehospitalization was had.  Patient is a DNAR/DNI with no heroic life prolonging measures desires.  The difference between a aggressive medical intervention path  and a palliative comfort care path for this patient at this time was had. We talked about transition to comfort measures in house and what that would entail inclusive of medications to control pain, dyspnea, agitation, nausea, itching, and hiccups.  We discussed stopping all uneccessary measures such as blood draws, needle sticks, and frequent vital signs.   Patients daughters were tearful during our conversation. They share that they have been through this before with their father. They share that they know Lindsey [name] would not want invasive procedures and wants to be with her cat. Utilized reflective listening throughout our time together.   Discussed the importance of continued conversation with family and their  medical providers regarding overall plan of care and treatment options, ensuring decisions are within the context of the patients values and GOCs.  Decision Maker: Lindsey [name] (Daughter) -  [phone]  SUMMARY OF RECOMMENDATIONS   DNAR/DNI  Transition focus to comfort oriented care  TOC --> Transfer back to [facility] with Hospice (they use [facility])  Symptoms as per [facility]  Code Status/Advance Care Planning: DNAR/DNI  Palliative Prophylaxis:   Oral Care, Aspiration  precautions, Turn Q2H  Additional Recommendations (Limitations, Scope, Preferences):  Continue current scope of care   Psycho-social/Spiritual:   Desire for further Chaplaincy support: Yes  Additional Recommendations: Education on hospice - end of life care   Prognosis: Poor in the setting of progressive dementia with failure to thrive. At this point is appropriate for hospice.  Discharge Planning: Discharge to [facility] Hospice  Vitals with BMI 11/27/2019 11/27/2019 11/27/2019  Height - - -  Weight - - -  BMI - - -  Systolic 834 196 -  Diastolic 53 43 -  Pulse 62 57 78   Oral Intake %:  5% I/O:  (-) 1,070 Bowel Movements:  x2  Mobility: PT recommended SNF though rarely gets OOB - Goals have now shifted  PPS: 10-20%   This conversation/these recommendations were discussed with patient primary care team, Dr. Wynelle Cleveland  Time In: 1230 Time Out: 1340 Total Time: 70 Greater than 50%  of this time was spent counseling and coordinating care related to the above assessment and plan.  [place] Team Team Cell Phone: [phone] Please utilize secure chat with additional questions, if there is no response within 30 minutes please call the above phone number  Palliative Medicine Team providers are available by phone from 7am to 7pm daily and can be reached through the team cell phone.  Should this patient require assistance outside of these hours, please call the patient's attending physician.

## 2019-11-27 NOTE — [place] FL2 (Signed)
[place] MEDICAID FL2 LEVEL OF CARE SCREENING TOOL     IDENTIFICATION  Patient Name: Lindsey [name] Birthdate: 09-27-1930 Sex: female Admission Date (Current Location): 11/22/2019  [facility] and [place] Number:  Producer, television/film/video and Address:  [facility], [address] [place], [place] [address]      Provider Number: [id number]  Attending Physician Name and Address:  Calvert Cantor, MD  Relative Name and Phone Number:  Mason Jim [phone]    Current Level of Care: Hospital Recommended Level of Care: Assisted Living Facility Prior Approval Number:    Date Approved/Denied:   PASRR Number:    Discharge Plan: Other (Comment) (ALF)    Current Diagnoses: Patient Active Problem List   Diagnosis Date Noted  . A-fib (HCC) 11/27/2019  . Palliative care by specialist   . Goals of care, counseling/discussion   . Terminal care   . Sepsis (HCC)   . COPD with acute exacerbation (HCC) 11/25/2019  . Chest pain 11/23/2019  . Acute CHF (congestive heart failure) (HCC) 11/23/2019  . Unspecified atrial fibrillation (HCC) 11/23/2019  . Chronic anticoagulation 11/23/2019  . Acute lower UTI 11/23/2019  . DNR (do not resuscitate) 11/23/2019    Orientation RESPIRATION BLADDER Height & Weight     Self, Situation, Place  O2 (Nasal Canula) 3 Liters  Continent, External catheter Weight: 155 lb 8 oz (70.5 kg) Height:  5\' 7"  (170.2 cm)  BEHAVIORAL SYMPTOMS/MOOD NEUROLOGICAL BOWEL NUTRITION STATUS      Continent Regular  AMBULATORY STATUS COMMUNICATION OF NEEDS Skin   Limited Assist Verbally Normal                       Personal Care Assistance Level of Assistance  Bathing, Feeding, Dressing Bathing Assistance: Limited assistance Feeding assistance: Independent Dressing Assistance: Limited assistance     Functional Limitations Info  Sight, Hearing, Speech Sight Info: Adequate Hearing Info: Impaired Speech Info: Adequate    SPECIAL CARE  FACTORS FREQUENCY                   Contractures Contractures Info: Not present    Additional Factors Info  Code Status, Allergies, Psychotropic, Insulin Sliding Scale     Psychotropic Info: DULoxetine (CYMBALTA) DR capsule 60 mg daily, gabapentin (NEURONTIN) capsule 100 mg 3x daily, LORazepam (ATIVAN) injection 1 mg        Current Medications (11/27/2019):  This is the current hospital active medication list Current Facility-Administered Medications  Medication Dose Route Frequency Provider Last Rate Last Admin  . acetaminophen (TYLENOL) tablet 650 mg  650 mg Oral Q6H PRN 01/27/2020 A, MD   650 mg at 11/26/19 0427   Or  . acetaminophen (TYLENOL) suppository 650 mg  650 mg Rectal Q6H PRN 01/26/20 A, MD      . albuterol (PROVENTIL) (2.5 MG/3ML) 0.083% nebulizer solution 2.5 mg  2.5 mg Nebulization Q2H PRN Madelyn Flavors, MD   2.5 mg at 11/26/19 2323  . albuterol (PROVENTIL) (2.5 MG/3ML) 0.083% nebulizer solution 2.5 mg  2.5 mg Nebulization BID 2324, MD   2.5 mg at 11/27/19 0746  . amiodarone (PACERONE) tablet 200 mg  200 mg Oral BID 01/27/20, PA-C      . diclofenac Sodium (VOLTAREN) 1 % topical gel 2 g  2 g Topical Daily PRN Graciella Freer A, MD   2 g at 11/23/19 2127  . docusate sodium (COLACE) capsule 100 mg  100 mg Oral  Wilford Corner, Rondell A, MD   100 mg at 11/27/19 0920  . doxycycline (VIBRA-TABS) tablet 100 mg  100 mg Oral Q12H Calvert Cantor, MD   100 mg at 11/27/19 0920  . DULoxetine (CYMBALTA) DR capsule 60 mg  60 mg Oral Daily Madelyn Flavors A, MD   60 mg at 11/27/19 0919  . gabapentin (NEURONTIN) capsule 100 mg  100 mg Oral TID Madelyn Flavors A, MD   100 mg at 11/27/19 0919  . hydrALAZINE (APRESOLINE) injection 10 mg  10 mg Intravenous Q4H PRN Smith, Rondell A, MD      . insulin aspart (novoLOG) injection 0-6 Units  0-6 Units Subcutaneous TID WC Madelyn Flavors A, MD   2 Units at 11/27/19 0730  . insulin glargine (LANTUS) injection 10  Units  10 Units Subcutaneous Daily Calvert Cantor, MD   10 Units at 11/27/19 0934  . ipratropium-albuterol (DUONEB) 0.5-2.5 (3) MG/3ML nebulizer solution 3 mL  3 mL Nebulization Q6H PRN Zierle-Ghosh, Asia B, DO      . LORazepam (ATIVAN) 2 MG/ML concentrated solution 1 mg  1 mg Oral Q3H PRN Ernie Avena, NP      . LORazepam (ATIVAN) injection 1 mg  1 mg Intravenous Once Zierle-Ghosh, Asia B, DO      . losartan (COZAAR) tablet 25 mg  25 mg Oral Daily Furth, Cadence H, PA-C   25 mg at 11/27/19 0919  . meclizine (ANTIVERT) tablet 12.5 mg  12.5 mg Oral Daily Katrinka Blazing, Rondell A, MD   12.5 mg at 11/27/19 0920  . meclizine (ANTIVERT) tablet 12.5 mg  12.5 mg Oral QHS PRN Madelyn Flavors A, MD      . morphine 10 MG/5ML solution 2.5 mg  2.5 mg Oral Q3H PRN Ernie Avena, NP      . multivitamin with minerals tablet 1 tablet  1 tablet Oral q morning - 10a Clydie Braun, MD   1 tablet at 11/27/19 0919  . ondansetron (ZOFRAN) tablet 4 mg  4 mg Oral Q6H PRN Madelyn Flavors A, MD       Or  . ondansetron (ZOFRAN) injection 4 mg  4 mg Intravenous Q6H PRN Smith, Rondell A, MD      . pantoprazole (PROTONIX) EC tablet 40 mg  40 mg Oral q morning - 10a Madelyn Flavors A, MD   40 mg at 11/27/19 0920  . Rivaroxaban (XARELTO) tablet 15 mg  15 mg Oral Daily Calvert Cantor, MD   15 mg at 11/27/19 0921  . SUMAtriptan (IMITREX) tablet 50 mg  50 mg Oral Q2H PRN Smith, Rondell A, MD      . tamsulosin (FLOMAX) capsule 0.4 mg  0.4 mg Oral q morning - 10a Smith, Rondell A, MD   0.4 mg at 11/27/19 0919     Discharge Medications:  amiodarone 200 MG tablet Commonly known as: PACERONE Take 1 tablet (200 mg total) by mouth daily.   aspirin 81 MG chewable tablet Chew 81 mg by mouth every morning.   atorvastatin 10 MG tablet Commonly known as: LIPITOR Take 10 mg by mouth every morning.   diclofenac Sodium 1 % Gel Commonly known as: VOLTAREN Apply 2 g topically daily as needed (pain).   docusate sodium 100  MG capsule Commonly known as: COLACE Take 100 mg by mouth every other day.   DULoxetine 60 MG capsule Commonly known as: CYMBALTA Take 60 mg by mouth every morning.   furosemide 40 MG tablet Commonly known as: LASIX Take 40  mg by mouth every morning.   gabapentin 100 MG capsule Commonly known as: NEURONTIN Take 100 mg by mouth 3 (three) times daily. 10a, 4p, 10p   meclizine 12.5 MG tablet Commonly known as: ANTIVERT Take 12.5 mg by mouth See admin instructions. Take one tablet (12.5 mg) by mouth daily at 8am, may also take one tablet (12.5 mg) every 12 hours as needed for dizziness - give 2nd dose at least 4 hours are morning scheduled dose   multivitamin with minerals Tabs tablet Take 1 tablet by mouth every morning.   pantoprazole 40 MG tablet Commonly known as: PROTONIX Take 40 mg by mouth every morning.   potassium chloride 10 MEQ tablet Commonly known as: KLOR-CON Take 10 mEq by mouth every Monday, Wednesday, and Friday.   rivaroxaban 20 MG Tabs tablet Commonly known as: XARELTO Take 20 mg by mouth every morning.   SUMAtriptan 50 MG tablet Commonly known as: IMITREX Take 50 mg by mouth every 2 (two) hours as needed for migraine.   tamsulosin 0.4 MG Caps capsule Commonly known as: FLOMAX Take 0.4 mg by mouth every morning.   tiZANidine 2 MG tablet Commonly known as: ZANAFLEX Take 2 mg by mouth every morning.     Relevant Imaging Results:  Relevant Lab Results:   Additional Information ssn [id number]  Carmina Miller, LCSWA

## 2019-11-27 NOTE — [place] FL2 (Signed)
[place] MEDICAID FL2 LEVEL OF CARE SCREENING TOOL     IDENTIFICATION  Patient Name: Lindsey [name] Birthdate: 12/19/1930 Sex: female Admission Date (Current Location): 11/22/2019  County and Medicaid Number:  [place]   Facility and Address:  [facility] [place], [address], [place], [place] [address]      Provider Number: [id number]  Attending Physician Name and Address:  Rizwan, Saima, MD  Relative Name and Phone Number:  Joanna Kooy [phone]    Current Level of Care: Hospital Recommended Level of Care: Assisted Living Facility Prior Approval Number:    Date Approved/Denied:   PASRR Number:    Discharge Plan: Other (Comment) (ALF)    Current Diagnoses: Patient Active Problem List   Diagnosis Date Noted  . A-fib (HCC) 11/27/2019  . Palliative care by specialist   . Goals of care, counseling/discussion   . Terminal care   . Sepsis (HCC)   . COPD with acute exacerbation (HCC) 11/25/2019  . Chest pain 11/23/2019  . Acute CHF (congestive heart failure) (HCC) 11/23/2019  . Unspecified atrial fibrillation (HCC) 11/23/2019  . Chronic anticoagulation 11/23/2019  . Acute lower UTI 11/23/2019  . DNR (do not resuscitate) 11/23/2019    Orientation RESPIRATION BLADDER Height & Weight     Self, Situation, Place  O2 (3 Liters) Continent, External catheter Weight: 155 lb 8 oz (70.5 kg) Height:  5' 7" (170.2 cm)  BEHAVIORAL SYMPTOMS/MOOD NEUROLOGICAL BOWEL NUTRITION STATUS      Continent  (Regular Diet)  AMBULATORY STATUS COMMUNICATION OF NEEDS Skin   Limited Assist Verbally Normal                       Personal Care Assistance Level of Assistance  Bathing, Feeding, Dressing Bathing Assistance: Limited assistance Feeding assistance: Independent Dressing Assistance: Limited assistance     Functional Limitations Info  Sight, Hearing, Speech Sight Info: Adequate Hearing Info: Impaired Speech Info: Adequate    SPECIAL CARE FACTORS  FREQUENCY  PT (By licensed PT), OT (By licensed OT)     PT Frequency:  (n/a) OT Frequency:  (n/a)            Contractures Contractures Info: Not present    Additional Factors Info  Code Status, Allergies, Psychotropic, Isolation Precautions Code Status Info: DNR Allergies Info: Diazepam Drug Ingredient ,Monosodium Glutamate, Iodinated Diagnostic Agent, Nitrofurantoin, Pineapple, Savella Milnacipran, Shellfish Allergy, Sulfa Antibiotics, Tramadol Psychotropic Info: DULoxetine (CYMBALTA) DR capsule 60 mg daily, gabapentin (NEURONTIN) capsule 100 mg 3x daily Insulin Sliding Scale Info:  (N/A)       Current Medications (11/27/2019):  This is the current hospital active medication list Current Facility-Administered Medications  Medication Dose Route Frequency Provider Last Rate Last Admin  . acetaminophen (TYLENOL) tablet 650 mg  650 mg Oral Q6H PRN Smith, Rondell A, MD   650 mg at 11/26/19 0427   Or  . acetaminophen (TYLENOL) suppository 650 mg  650 mg Rectal Q6H PRN Smith, Rondell A, MD      . albuterol (PROVENTIL) (2.5 MG/3ML) 0.083% nebulizer solution 2.5 mg  2.5 mg Nebulization Q2H PRN Feliz Ortiz, Abraham, MD   2.5 mg at 11/26/19 2323  . albuterol (PROVENTIL) (2.5 MG/3ML) 0.083% nebulizer solution 2.5 mg  2.5 mg Nebulization BID Rizwan, Saima, MD   2.5 mg at 11/27/19 0746  . amiodarone (PACERONE) tablet 200 mg  200 mg Oral BID Tillery, Michael Andrew, PA-C      . diclofenac Sodium (VOLTAREN) 1 % topical gel   2 g  2 g Topical Daily PRN Madelyn Flavors A, MD   2 g at 11/23/19 2127  . docusate sodium (COLACE) capsule 100 mg  100 mg Oral QODAY Smith, Rondell A, MD   100 mg at 11/27/19 0920  . doxycycline (VIBRA-TABS) tablet 100 mg  100 mg Oral Q12H Calvert Cantor, MD   100 mg at 11/27/19 0920  . DULoxetine (CYMBALTA) DR capsule 60 mg  60 mg Oral Daily Madelyn Flavors A, MD   60 mg at 11/27/19 0919  . gabapentin (NEURONTIN) capsule 100 mg  100 mg Oral TID Madelyn Flavors A, MD   100 mg at  11/27/19 0919  . hydrALAZINE (APRESOLINE) injection 10 mg  10 mg Intravenous Q4H PRN Smith, Rondell A, MD      . insulin aspart (novoLOG) injection 0-6 Units  0-6 Units Subcutaneous TID WC Madelyn Flavors A, MD   2 Units at 11/27/19 0730  . insulin glargine (LANTUS) injection 10 Units  10 Units Subcutaneous Daily Calvert Cantor, MD   10 Units at 11/27/19 0934  . ipratropium-albuterol (DUONEB) 0.5-2.5 (3) MG/3ML nebulizer solution 3 mL  3 mL Nebulization Q6H PRN Zierle-Ghosh, Asia B, DO      . LORazepam (ATIVAN) 2 MG/ML concentrated solution 1 mg  1 mg Oral Q3H PRN Ernie Avena, NP      . LORazepam (ATIVAN) injection 1 mg  1 mg Intravenous Once Zierle-Ghosh, Asia B, DO      . losartan (COZAAR) tablet 25 mg  25 mg Oral Daily Furth, Cadence H, PA-C   25 mg at 11/27/19 0919  . meclizine (ANTIVERT) tablet 12.5 mg  12.5 mg Oral Daily Katrinka Blazing, Rondell A, MD   12.5 mg at 11/27/19 0920  . meclizine (ANTIVERT) tablet 12.5 mg  12.5 mg Oral QHS PRN Madelyn Flavors A, MD      . morphine 10 MG/5ML solution 2.5 mg  2.5 mg Oral Q3H PRN Ernie Avena, NP      . multivitamin with minerals tablet 1 tablet  1 tablet Oral q morning - 10a Clydie Braun, MD   1 tablet at 11/27/19 0919  . ondansetron (ZOFRAN) tablet 4 mg  4 mg Oral Q6H PRN Madelyn Flavors A, MD       Or  . ondansetron (ZOFRAN) injection 4 mg  4 mg Intravenous Q6H PRN Smith, Rondell A, MD      . pantoprazole (PROTONIX) EC tablet 40 mg  40 mg Oral q morning - 10a Madelyn Flavors A, MD   40 mg at 11/27/19 0920  . Rivaroxaban (XARELTO) tablet 15 mg  15 mg Oral Daily Calvert Cantor, MD   15 mg at 11/27/19 0921  . SUMAtriptan (IMITREX) tablet 50 mg  50 mg Oral Q2H PRN Smith, Rondell A, MD      . tamsulosin (FLOMAX) capsule 0.4 mg  0.4 mg Oral q morning - 10a Madelyn Flavors A, MD   0.4 mg at 11/27/19 0919     Discharge Medications: Please see discharge summary for a list of discharge medications.  Relevant Imaging Results:  Relevant Lab  Results:   Additional Information ssn [id number]  Carmina Miller, LCSWA

## 2019-11-27 NOTE — Discharge Summary (Addendum)
Physician Discharge Summary  Jossilyn Benda WUJ:[id number] DOB: [birth year] DOA: 11/22/2019  PCP: Patient, No Pcp Per  Admit date: 11/22/2019 Discharge date: 11/27/2019  Admitted From: ALF Disposition:  ALF   Recommendations for Outpatient Follow-up:  1. Return to ALF with hospice   Discharge Condition:  stable   CODE STATUS:  DNR   Diet recommendation:  Regular diet Consultations:  Cardiology  EP  Procedures/Studies:  2 D ECHO   1. Left ventricular ejection fraction, by estimation, is 40 to 45%. The  left ventricle has mildly decreased function. The left ventricle  demonstrates regional wall motion abnormalities (see scoring  diagram/findings for description). There is moderate  asymmetric left ventricular hypertrophy of the basal-septal segment. Left  ventricular diastolic function could not be evaluated. There is severe  hypokinesis of the left ventricular, entire septal wall, apical segment  and inferoseptal wall.  2. Right ventricular systolic function is mildly reduced. The right  ventricular size is normal.  3. Left atrial size was severely dilated.  4. The mitral valve is abnormal. Mild mitral valve regurgitation.  5. The aortic valve is tricuspid. Aortic valve regurgitation is mild.  Mild aortic valve sclerosis is present, with no evidence of aortic valve  stenosis.   Discharge Diagnoses:  Principal Problem:   COPD with acute exacerbation - Sepsis POA Active Problems:   Acute CHF (congestive heart failure)     A-fib with RVR   Chest pain   Chronic anticoagulation   DNR (do not resuscitate)   Brief Summary: Yesika Rispoli is an 86 y.o.female from assisted living with apast medical history significant for essential hypertension, paroxysmal atrial fibrillation on Eliquis, diabetes mellitus type 2 comes in complaining of shortness of breath that started 1 week prior to admission. She was recently seen in the ED on 11/15/2019 with reports of not feeling  well was found to have a UTI started on antibiotics.  In the ED she was noted to have a temperature of 100.4, heart rate of 111, respiratory rate in the high 20s with a slightly elevated WBC count of 11.3. Chest x-ray was unrevealing Covid PCR was negative  Hospital Course:  Principal Problem: Acute COPD exacerbation with acute bronchitis- sepsis POA - Low-grade fever, tachycardia and tachypnea - She was started on IV steroids and IV antibiotics (doxycycline) on 8/31  -  - weaning Solumedrol now   Active Problems:  A-fib with RVR - started on Amiodarone IV- HR dropped to 40s- remains bradycardic today - ? Tachy-brady syndrome - cardiology consulting EP today for eval    Chest pain   Acute CHF (congestive heart failure)  - continue attempts for rate control as this is driving her CHF - appreciate cardiology follow up- - - cont diuretics per cardiology- EF is 40-45% and there is focal hypokinesis (see ECHO below)- RV systolic function also mildly reduced  ? Panic attacks - she has had "episodes" of shortness of breath and racing heart for about 2 wks now - difficult to determine if her anxiety stems from her A-fib RVR or if the anxiety is driving the RVR- -I gave her a small dose of Ativan  On 9/2 to help with her anxiety- this seemed to help  DM 2, controlled - sugars are elevated to 2-300s from steroids- I have added Lantus  - cont Novolog - A1c on 8/30 was 6.9  UTI? -UA  on 8/30 was negative  Disposition: patient and daughter Randa Evens requested a palliative care consult today. They have decided to  pursue comfort care and would like to return to her assisted living facility with hospice.    Discharge Exam: Vitals:   11/27/19 0914 11/27/19 1151  BP: (!) 111/43 (!) 104/53  Pulse: (!) 57 62  Resp: 20 20  Temp: 98.1 F (36.7 C)   SpO2: 100% 99%   Vitals:   11/27/19 0400 11/27/19 0747 11/27/19 0914 11/27/19 1151  BP:   (!) 111/43 (!) 104/53  Pulse:  78 (!) 57 62   Resp:  (!) 21 20 20   Temp:   98.1 F (36.7 C)   TempSrc:   Oral   SpO2:  94% 100% 99%  Weight: 70.5 kg     Height:        General: Pt is alert, awake, not in acute distress Cardiovascular: IIRR, S1/S2 +, no rubs, no gallops Respiratory: CTA bilaterally, no wheezing, no rhonchi Abdominal: Soft, NT, ND, bowel sounds + Extremities: no edema, no cyanosis   Discharge Instructions   Allergies as of 11/27/2019      Reactions   Diazepam    Unknown reaction   Drug Ingredient [monosodium Glutamate]    MSG - Unknown reaction   Iodinated Diagnostic Agents    Unknown reaction   Nitrofurantoin    Unknown reaction   Pineapple    Unknown reaction   Savella [milnacipran]    Unknown reaction   Shellfish Allergy    Unknown reaction   Sulfa Antibiotics    Unknown reaction   Tramadol    Unknown reaction      Medication List    STOP taking these medications   cephALEXin 500 MG capsule Commonly known as: Keflex   metFORMIN 500 MG tablet Commonly known as: GLUCOPHAGE   metoprolol succinate 100 MG 24 hr tablet Commonly known as: TOPROL-XL     TAKE these medications   amiodarone 200 MG tablet Commonly known as: PACERONE Take 1 tablet (200 mg total) by mouth daily.   aspirin 81 MG chewable tablet Chew 81 mg by mouth every morning.   atorvastatin 10 MG tablet Commonly known as: LIPITOR Take 10 mg by mouth every morning.   diclofenac Sodium 1 % Gel Commonly known as: VOLTAREN Apply 2 g topically daily as needed (pain).   docusate sodium 100 MG capsule Commonly known as: COLACE Take 100 mg by mouth every other day.   DULoxetine 60 MG capsule Commonly known as: CYMBALTA Take 60 mg by mouth every morning.   furosemide 40 MG tablet Commonly known as: LASIX Take 40 mg by mouth every morning.   gabapentin 100 MG capsule Commonly known as: NEURONTIN Take 100 mg by mouth 3 (three) times daily. 10a, 4p, 10p   meclizine 12.5 MG tablet Commonly known as: ANTIVERT Take  12.5 mg by mouth See admin instructions. Take one tablet (12.5 mg) by mouth daily at 8am, may also take one tablet (12.5 mg) every 12 hours as needed for dizziness - give 2nd dose at least 4 hours are morning scheduled dose   multivitamin with minerals Tabs tablet Take 1 tablet by mouth every morning.   pantoprazole 40 MG tablet Commonly known as: PROTONIX Take 40 mg by mouth every morning.   potassium chloride 10 MEQ tablet Commonly known as: KLOR-CON Take 10 mEq by mouth every Monday, Wednesday, and Friday.   rivaroxaban 20 MG Tabs tablet Commonly known as: XARELTO Take 20 mg by mouth every morning.   SUMAtriptan 50 MG tablet Commonly known as: IMITREX Take 50 mg by mouth every 2 (  two) hours as needed for migraine.   tamsulosin 0.4 MG Caps capsule Commonly known as: FLOMAX Take 0.4 mg by mouth every morning.   tiZANidine 2 MG tablet Commonly known as: ZANAFLEX Take 2 mg by mouth every morning.       Allergies  Allergen Reactions   Diazepam     Unknown reaction   Drug Ingredient [Monosodium Glutamate]     MSG - Unknown reaction   Iodinated Diagnostic Agents     Unknown reaction   Nitrofurantoin     Unknown reaction   Pineapple     Unknown reaction   Savella [Milnacipran]     Unknown reaction   Shellfish Allergy     Unknown reaction   Sulfa Antibiotics     Unknown reaction   Tramadol     Unknown reaction      DG Chest 2 View  Result Date: 11/22/2019 CLINICAL DATA:  Severe lower mid chest pain for 1 day, difficulty breathing EXAM: CHEST - 2 VIEW COMPARISON:  11/15/2019 FINDINGS: Frontal and lateral views of the chest demonstrate a stable cardiac silhouette allowing for differences in positioning and technique. No acute airspace disease, effusion, or pneumothorax. Stable atherosclerosis of the aorta. No acute bony abnormalities. IMPRESSION: 1. No acute intrathoracic process. Electronically Signed   By: Sharlet SalinaMichael  Brown M.D.   On: 11/22/2019 19:26   CT  CHEST WO CONTRAST  Result Date: 11/24/2019 CLINICAL DATA:  Chronic shortness of breath. EXAM: CT CHEST WITHOUT CONTRAST TECHNIQUE: Multidetector CT imaging of the chest was performed following the standard protocol without IV contrast. COMPARISON:  Chest radiograph 11/22/2019 FINDINGS: Cardiovascular: Mild cardiomegaly. No pericardial effusion. Coronary artery and aortic calcified atherosclerosis. Mediastinum/Nodes: No enlarged mediastinal or axillary lymph nodes. Thyroid gland, trachea, and esophagus demonstrate no significant findings. Lungs/Pleura: Diffuse bronchial wall thickening. Ground-glass opacities within the dependent lungs and lingula, compatible with atelectasis. Mild interlobular septal thickening, most conspicuous in the lung bases. Small bilateral pleural effusions. No pneumothorax Upper Abdomen: No acute abnormality. Calcifications along the left diaphragm. Musculoskeletal: Multilevel degenerative changes of the spine. No evidence of acute osseous abnormality. IMPRESSION: 1. Mild interstitial edema with small bilateral pleural effusions and mild cardiomegaly. 2. Diffuse bronchial wall thickening, as can be seen with bronchitis. 3. Atelectasis without focal consolidation. Electronically Signed   By: Feliberto HartsFrederick S Jones MD   On: 11/24/2019 10:34   DG CHEST PORT 1 VIEW  Result Date: 11/27/2019 CLINICAL DATA:  Shortness of breath, unable to fully arose from sleep in order to obtain history. Unable to remove necklace. EXAM: PORTABLE CHEST 1 VIEW.  The patient is rotated. COMPARISON:  Chest x-ray 11/26/2019, CT chest 11/24/2019 FINDINGS: Similar-appearing enlarged cardiac silhouette. Otherwise the mediastinal contours are unchanged. Aortic arch and descending thoracic aorta calcifications. Low lung volumes. Slight interval decrease in interstitial markings. Similar-appearing retrocardiac opacity. No new focal consolidation. No pulmonary edema. No definite right pleural effusion. Similar-appearing small  left pleural effusion. The visualized skeletal structures demonstrate no acute findings. IMPRESSION: Similar-appearing retrocardiac opacity which may represent atelectasis versus infection/inflammation. Interval improvement of residual mild pulmonary edema. Similar-appearing small left pleural effusion. Interval resolution of right pleural effusion. Electronically Signed   By: Tish FredericksonMorgane  Naveau M.D.   On: 11/27/2019 09:20   DG CHEST PORT 1 VIEW  Result Date: 11/26/2019 CLINICAL DATA:  Dyspnea, rapid response EXAM: PORTABLE CHEST 1 VIEW COMPARISON:  11/24/2019, 11/22/2019 FINDINGS: Single frontal view of the chest demonstrates a stable cardiac silhouette. There is increased central vascular congestion and interstitial prominence, with  progressive left basilar consolidation. Small bilateral pleural effusions are noted. No pneumothorax. No acute bony abnormalities. IMPRESSION: 1. Worsening volume status, with increased interstitial edema and small bilateral pleural effusions. 2. Increasing left basilar consolidation may reflect atelectasis. Electronically Signed   By: Sharlet Salina M.D.   On: 11/26/2019 03:01   DG Chest Port 1 View  Result Date: 11/15/2019 CLINICAL DATA:  Fever, lethargy EXAM: PORTABLE CHEST 1 VIEW COMPARISON:  None. FINDINGS: Single frontal view of the chest demonstrates an unremarkable cardiac silhouette. No airspace disease, effusion, or pneumothorax. No acute bony abnormalities. IMPRESSION: 1. No acute intrathoracic process. Electronically Signed   By: Sharlet Salina M.D.   On: 11/15/2019 15:05   ECHOCARDIOGRAM COMPLETE  Result Date: 11/23/2019    ECHOCARDIOGRAM REPORT   Patient Name:   BRENDY FICEK Date of Exam: 11/23/2019 Medical Rec #:  [id number]        Height:       67.0 in Accession #:    [id number]       Weight:       158.0 lb Date of Birth:  1930/08/01        BSA:          1.829 m Patient Age:    89 years         BP:           155/73 mmHg Patient Gender: F                HR:            94 bpm. Exam Location:  Inpatient Procedure: 2D Echo, Cardiac Doppler and Color Doppler Indications:    Atrial Fibrillation 427.31 / I48.91  History:        Patient has no prior history of Echocardiogram examinations.                 Risk Factors:Hypertension.  Sonographer:    Eulah Pont RDCS Referring Phys: [id number] RONDELL A SMITH IMPRESSIONS  1. Left ventricular ejection fraction, by estimation, is 40 to 45%. The left ventricle has mildly decreased function. The left ventricle demonstrates regional wall motion abnormalities (see scoring diagram/findings for description). There is moderate asymmetric left ventricular hypertrophy of the basal-septal segment. Left ventricular diastolic function could not be evaluated. There is severe hypokinesis of the left ventricular, entire septal wall, apical segment and inferoseptal wall.  2. Right ventricular systolic function is mildly reduced. The right ventricular size is normal.  3. Left atrial size was severely dilated.  4. The mitral valve is abnormal. Mild mitral valve regurgitation.  5. The aortic valve is tricuspid. Aortic valve regurgitation is mild. Mild aortic valve sclerosis is present, with no evidence of aortic valve stenosis. FINDINGS  Left Ventricle: Left ventricular ejection fraction, by estimation, is 40 to 45%. The left ventricle has mildly decreased function. The left ventricle demonstrates regional wall motion abnormalities. Severe hypokinesis of the left ventricular, entire septal wall, apical segment and inferoseptal wall. The left ventricular internal cavity size was normal in size. There is moderate asymmetric left ventricular hypertrophy of the basal-septal segment. Left ventricular diastolic function could not be evaluated due to atrial fibrillation. Left ventricular diastolic function could not be evaluated. Right Ventricle: The right ventricular size is normal. No increase in right ventricular wall thickness. Right ventricular systolic  function is mildly reduced. Left Atrium: Left atrial size was severely dilated. Right Atrium: Right atrial size was normal in size. Pericardium: There is no evidence of pericardial effusion. Mitral Valve:  The mitral valve is abnormal. There is mild thickening of the mitral valve leaflet(s). Mild mitral annular calcification. Mild mitral valve regurgitation. Tricuspid Valve: The tricuspid valve is grossly normal. Tricuspid valve regurgitation is trivial. Aortic Valve: The aortic valve is tricuspid. Aortic valve regurgitation is mild. Aortic regurgitation PHT measures 456 msec. Mild aortic valve sclerosis is present, with no evidence of aortic valve stenosis. Pulmonic Valve: The pulmonic valve was not well visualized. Pulmonic valve regurgitation is not visualized. Aorta: The aortic root and ascending aorta are structurally normal, with no evidence of dilitation. IAS/Shunts: No atrial level shunt detected by color flow Doppler.  LEFT VENTRICLE PLAX 2D LVIDd:         5.10 cm LVIDs:         3.60 cm LV PW:         1.20 cm LV IVS:        1.20 cm LVOT diam:     2.20 cm LV SV:         45 LV SV Index:   25 LVOT Area:     3.80 cm  LV Volumes (MOD) LV vol d, MOD A2C: 69.8 ml LV vol d, MOD A4C: 78.7 ml LV vol s, MOD A2C: 43.4 ml LV vol s, MOD A4C: 46.0 ml LV SV MOD A2C:     26.4 ml LV SV MOD A4C:     78.7 ml LV SV MOD BP:      31.6 ml RIGHT VENTRICLE TAPSE (M-mode): 1.0 cm LEFT ATRIUM              Index       RIGHT ATRIUM           Index LA diam:        4.80 cm  2.62 cm/m  RA Area:     19.50 cm LA Vol (A2C):   117.0 ml 63.95 ml/m RA Volume:   49.00 ml  26.78 ml/m LA Vol (A4C):   83.9 ml  45.86 ml/m LA Biplane Vol: 98.5 ml  53.84 ml/m  AORTIC VALVE LVOT Vmax:   72.90 cm/s LVOT Vmean:  51.000 cm/s LVOT VTI:    0.118 m AI PHT:      456 msec  AORTA Ao Root diam: 3.10 cm Ao Asc diam:  3.00 cm MR Peak grad:    109.4 mmHg  TRICUSPID VALVE MR Mean grad:    68.0 mmHg   TR Peak grad:   31.1 mmHg MR Vmax:         523.00 cm/s TR  Vmax:        279.00 cm/s MR Vmean:        388.0 cm/s MR PISA:         0.57 cm    SHUNTS MR PISA Eff ROA: 4 mm       Systemic VTI:  0.12 m MR PISA Radius:  0.30 cm     Systemic Diam: 2.20 cm Zoila Shutter MD Electronically signed by Zoila Shutter MD Signature Date/Time: 11/23/2019/4:42:56 PM    Final      The results of significant diagnostics from this hospitalization (including imaging, microbiology, ancillary and laboratory) are listed below for reference.     Microbiology: Recent Results (from the past 240 hour(s))  SARS Coronavirus 2 by RT PCR (hospital order, performed in [facility] hospital lab) Nasopharyngeal Nasopharyngeal Swab     Status: None   Collection Time: 11/23/19  5:18 AM   Specimen: Nasopharyngeal Swab  Result Value Ref Range  Status   SARS Coronavirus 2 NEGATIVE NEGATIVE Final    Comment: (NOTE) SARS-CoV-2 target nucleic acids are NOT DETECTED.  The SARS-CoV-2 RNA is generally detectable in upper and lower respiratory specimens during the acute phase of infection. The lowest concentration of SARS-CoV-2 viral copies this assay can detect is 250 copies / mL. A negative result does not preclude SARS-CoV-2 infection and should not be used as the sole basis for treatment or other patient management decisions.  A negative result may occur with improper specimen collection / handling, submission of specimen other than nasopharyngeal swab, presence of viral mutation(s) within the areas targeted by this assay, and inadequate number of viral copies (<250 copies / mL). A negative result must be combined with clinical observations, patient history, and epidemiological information.  Fact Sheet for Patients:   BoilerBrush.com.cy  Fact Sheet for Healthcare Providers: https://pope.com/  This test is not yet approved or  cleared by the [place] FDA and has been authorized for detection and/or diagnosis of SARS-CoV-2 by FDA  under an Emergency Use Authorization (EUA).  This EUA will remain in effect (meaning this test can be used) for the duration of the COVID-19 declaration under Section 564(b)(1) of the Act, 21 U.S.C. section 360bbb-3(b)(1), unless the authorization is terminated or revoked sooner.  Performed at [facility], [address] [place], [place] [address]   Culture, blood (routine x 2)     Status: None (Preliminary result)   Collection Time: 11/23/19  7:50 PM   Specimen: BLOOD LEFT HAND  Result Value Ref Range Status   Specimen Description BLOOD LEFT HAND  Final   Special Requests   Final    BOTTLES DRAWN AEROBIC ONLY Blood Culture adequate volume   Culture   Final    NO GROWTH 4 DAYS Performed at [facility], [address], [place], [place] [address]    Report Status PENDING  Incomplete  Culture, blood (routine x 2)     Status: None (Preliminary result)   Collection Time: 11/23/19  7:51 PM   Specimen: BLOOD RIGHT ARM  Result Value Ref Range Status   Specimen Description BLOOD RIGHT ARM  Final   Special Requests   Final    BOTTLES DRAWN AEROBIC ONLY Blood Culture adequate volume   Culture   Final    NO GROWTH 4 DAYS Performed at [facility], [address], [place], [place] [address]    Report Status PENDING  Incomplete     Labs: BNP (last 3 results) Recent Labs    11/23/19 0518  BNP 687.3*   Basic Metabolic Panel: Recent Labs  Lab 11/22/19 1814 11/24/19 0702 11/25/19 0545 11/26/19 0910  NA 137 137 137 137  K 3.8 3.8 4.2 4.4  CL 103 101 104 105  CO2 21*  GLUCOSE 180* 187* 284* 270*  BUN 37*  CREATININE 0.69 0.77 0.90 1.39*  CALCIUM 9.2 9.2 9.4 9.4   Liver Function Tests: No results for input(s): AST, ALT, ALKPHOS, BILITOT, PROT, ALBUMIN in the last 168 hours. No results for input(s): LIPASE, AMYLASE in the last 168 hours. No results for input(s): AMMONIA in the last 168 hours. CBC: Recent Labs  Lab 11/22/19 1814  11/24/19 0702  WBC 11.3* 10.4  HGB 11.1* 11.4*  HCT 35.2* 35.6*  MCV 88.9 89.0  PLT 369 326   Cardiac Enzymes: No results for input(s): CKTOTAL, CKMB, CKMBINDEX, TROPONINI in the last 168 hours. BNP: Invalid  input(s): POCBNP CBG: Recent Labs  Lab 11/26/19 1258 11/26/19 1708 11/26/19 2129 11/27/19 0637 11/27/19 1152  GLUCAP 234* 225* 209* 206* 183*   D-Dimer No results for input(s): DDIMER in the last 72 hours. Hgb A1c No results for input(s): HGBA1C in the last 72 hours. Lipid Profile Recent Labs    11/25/19 0545  CHOL 137  HDL 39*  LDLCALC 83  TRIG 74  CHOLHDL 3.5   Thyroid function studies No results for input(s): TSH, T4TOTAL, T3FREE, THYROIDAB in the last 72 hours.  Invalid input(s): FREET3 Anemia work up No results for input(s): VITAMINB12, FOLATE, FERRITIN, TIBC, IRON, RETICCTPCT in the last 72 hours. Urinalysis    Component Value Date/Time   COLORURINE YELLOW 11/23/2019 0630   APPEARANCEUR HAZY (A) 11/23/2019 0630   LABSPEC 1.014 11/23/2019 0630   PHURINE 5.0 11/23/2019 0630   GLUCOSEU NEGATIVE 11/23/2019 0630   HGBUR NEGATIVE 11/23/2019 0630   BILIRUBINUR NEGATIVE 11/23/2019 0630   KETONESUR NEGATIVE 11/23/2019 0630   PROTEINUR NEGATIVE 11/23/2019 0630   NITRITE NEGATIVE 11/23/2019 0630   LEUKOCYTESUR NEGATIVE 11/23/2019 0630   Sepsis Labs Invalid input(s): PROCALCITONIN,  WBC,  LACTICIDVEN Microbiology Recent Results (from the past 240 hour(s))  SARS Coronavirus 2 by RT PCR (hospital order, performed in [facility] hospital lab) Nasopharyngeal Nasopharyngeal Swab     Status: None   Collection Time: 11/23/19  5:18 AM   Specimen: Nasopharyngeal Swab  Result Value Ref Range Status   SARS Coronavirus 2 NEGATIVE NEGATIVE Final    Comment: (NOTE) SARS-CoV-2 target nucleic acids are NOT DETECTED.  The SARS-CoV-2 RNA is generally detectable in upper and lower respiratory specimens during the acute phase of infection. The lowest concentration of  SARS-CoV-2 viral copies this assay can detect is 250 copies / mL. A negative result does not preclude SARS-CoV-2 infection and should not be used as the sole basis for treatment or other patient management decisions.  A negative result may occur with improper specimen collection / handling, submission of specimen other than nasopharyngeal swab, presence of viral mutation(s) within the areas targeted by this assay, and inadequate number of viral copies (<250 copies / mL). A negative result must be combined with clinical observations, patient history, and epidemiological information.  Fact Sheet for Patients:   BoilerBrush.com.cy  Fact Sheet for Healthcare Providers: https://pope.com/  This test is not yet approved or  cleared by the [place] FDA and has been authorized for detection and/or diagnosis of SARS-CoV-2 by FDA under an Emergency Use Authorization (EUA).  This EUA will remain in effect (meaning this test can be used) for the duration of the COVID-19 declaration under Section 564(b)(1) of the Act, 21 U.S.C. section 360bbb-3(b)(1), unless the authorization is terminated or revoked sooner.  Performed at [facility], [address] [place], [place] [address]   Culture, blood (routine x 2)     Status: None (Preliminary result)   Collection Time: 11/23/19  7:50 PM   Specimen: BLOOD LEFT HAND  Result Value Ref Range Status   Specimen Description BLOOD LEFT HAND  Final   Special Requests   Final    BOTTLES DRAWN AEROBIC ONLY Blood Culture adequate volume   Culture   Final    NO GROWTH 4 DAYS Performed at [facility], [address], [place], [place] [address]    Report Status PENDING  Incomplete  Culture, blood (routine x 2)     Status: None (Preliminary result)   Collection Time: 11/23/19  7:51  PM   Specimen: BLOOD RIGHT ARM  Result Value Ref Range Status   Specimen Description BLOOD RIGHT ARM  Final    Special Requests   Final    BOTTLES DRAWN AEROBIC ONLY Blood Culture adequate volume   Culture   Final    NO GROWTH 4 DAYS Performed at [facility], [address], [place], [place] [address]    Report Status PENDING  Incomplete     Time coordinating discharge in minutes: 65  SIGNED:   Calvert Cantor, MD  [facility] 11/27/2019, 1:27 PM

## 2019-11-28 LAB — CULTURE, BLOOD (ROUTINE X 2)
Culture: NO GROWTH
Culture: NO GROWTH
Special Requests: ADEQUATE
Special Requests: ADEQUATE

## 2019-12-22 ENCOUNTER — Encounter (HOSPITAL_COMMUNITY): Payer: Self-pay | Admitting: Emergency Medicine

## 2019-12-22 ENCOUNTER — Inpatient Hospital Stay (HOSPITAL_COMMUNITY)
Admission: EM | Admit: 2019-12-22 | Discharge: 2019-12-24 | DRG: 291 | Disposition: A | Discharge status: Skilled Nursing Facility | Source: Skilled Nursing Facility | Attending: Internal Medicine | Admitting: Internal Medicine

## 2019-12-22 ENCOUNTER — Other Ambulatory Visit: Payer: Self-pay

## 2019-12-22 ENCOUNTER — Inpatient Hospital Stay (HOSPITAL_COMMUNITY)

## 2019-12-22 ENCOUNTER — Emergency Department (HOSPITAL_COMMUNITY)

## 2019-12-22 DIAGNOSIS — Z888 Allergy status to other drugs, medicaments and biological substances status: Secondary | ICD-10-CM | POA: Diagnosis not present

## 2019-12-22 DIAGNOSIS — Z20822 Contact with and (suspected) exposure to covid-19: Secondary | ICD-10-CM | POA: Diagnosis present

## 2019-12-22 DIAGNOSIS — I495 Sick sinus syndrome: Secondary | ICD-10-CM | POA: Diagnosis present

## 2019-12-22 DIAGNOSIS — F0390 Unspecified dementia without behavioral disturbance: Secondary | ICD-10-CM | POA: Diagnosis present

## 2019-12-22 DIAGNOSIS — H9190 Unspecified hearing loss, unspecified ear: Secondary | ICD-10-CM | POA: Diagnosis present

## 2019-12-22 DIAGNOSIS — D638 Anemia in other chronic diseases classified elsewhere: Secondary | ICD-10-CM | POA: Diagnosis present

## 2019-12-22 DIAGNOSIS — Z79899 Other long term (current) drug therapy: Secondary | ICD-10-CM | POA: Diagnosis not present

## 2019-12-22 DIAGNOSIS — J449 Chronic obstructive pulmonary disease, unspecified: Secondary | ICD-10-CM | POA: Diagnosis present

## 2019-12-22 DIAGNOSIS — Z66 Do not resuscitate: Secondary | ICD-10-CM | POA: Diagnosis present

## 2019-12-22 DIAGNOSIS — E785 Hyperlipidemia, unspecified: Secondary | ICD-10-CM | POA: Diagnosis present

## 2019-12-22 DIAGNOSIS — Z91013 Allergy to seafood: Secondary | ICD-10-CM

## 2019-12-22 DIAGNOSIS — R0602 Shortness of breath: Secondary | ICD-10-CM

## 2019-12-22 DIAGNOSIS — I480 Paroxysmal atrial fibrillation: Secondary | ICD-10-CM | POA: Diagnosis present

## 2019-12-22 DIAGNOSIS — F419 Anxiety disorder, unspecified: Secondary | ICD-10-CM | POA: Diagnosis present

## 2019-12-22 DIAGNOSIS — Z885 Allergy status to narcotic agent status: Secondary | ICD-10-CM

## 2019-12-22 DIAGNOSIS — Z7982 Long term (current) use of aspirin: Secondary | ICD-10-CM

## 2019-12-22 DIAGNOSIS — I110 Hypertensive heart disease with heart failure: Principal | ICD-10-CM | POA: Diagnosis present

## 2019-12-22 DIAGNOSIS — Z882 Allergy status to sulfonamides status: Secondary | ICD-10-CM

## 2019-12-22 DIAGNOSIS — N183 Chronic kidney disease, stage 3 unspecified: Secondary | ICD-10-CM | POA: Insufficient documentation

## 2019-12-22 DIAGNOSIS — I5023 Acute on chronic systolic (congestive) heart failure: Secondary | ICD-10-CM | POA: Diagnosis present

## 2019-12-22 DIAGNOSIS — I5021 Acute systolic (congestive) heart failure: Secondary | ICD-10-CM

## 2019-12-22 DIAGNOSIS — E876 Hypokalemia: Secondary | ICD-10-CM | POA: Diagnosis present

## 2019-12-22 DIAGNOSIS — K219 Gastro-esophageal reflux disease without esophagitis: Secondary | ICD-10-CM | POA: Diagnosis present

## 2019-12-22 DIAGNOSIS — Z91041 Radiographic dye allergy status: Secondary | ICD-10-CM

## 2019-12-22 DIAGNOSIS — Z91018 Allergy to other foods: Secondary | ICD-10-CM | POA: Diagnosis not present

## 2019-12-22 DIAGNOSIS — J9601 Acute respiratory failure with hypoxia: Secondary | ICD-10-CM | POA: Diagnosis present

## 2019-12-22 DIAGNOSIS — D649 Anemia, unspecified: Secondary | ICD-10-CM | POA: Diagnosis present

## 2019-12-22 HISTORY — DX: Chronic obstructive pulmonary disease, unspecified: J44.9

## 2019-12-22 LAB — CBC WITH DIFFERENTIAL/PLATELET
Abs Immature Granulocytes: 0.04 10*3/uL (ref 0.00–0.07)
Basophils Absolute: 0.1 10*3/uL (ref 0.0–0.1)
Basophils Relative: 1 %
Eosinophils Absolute: 0.2 10*3/uL (ref 0.0–0.5)
Eosinophils Relative: 2 %
HCT: 33.6 % — ABNORMAL LOW (ref 36.0–46.0)
Hemoglobin: 10.3 g/dL — ABNORMAL LOW (ref 12.0–15.0)
Immature Granulocytes: 1 %
Lymphocytes Relative: 22 %
Lymphs Abs: 1.9 10*3/uL (ref 0.7–4.0)
MCH: 27.5 pg (ref 26.0–34.0)
MCHC: 30.7 g/dL (ref 30.0–36.0)
MCV: 89.6 fL (ref 80.0–100.0)
Monocytes Absolute: 0.7 10*3/uL (ref 0.1–1.0)
Monocytes Relative: 8 %
Neutro Abs: 5.6 10*3/uL (ref 1.7–7.7)
Neutrophils Relative %: 66 %
Platelets: 387 10*3/uL (ref 150–400)
RBC: 3.75 MIL/uL — ABNORMAL LOW (ref 3.87–5.11)
RDW: 14.5 % (ref 11.5–15.5)
WBC: 8.5 10*3/uL (ref 4.0–10.5)
nRBC: 0 % (ref 0.0–0.2)

## 2019-12-22 LAB — BASIC METABOLIC PANEL
Anion gap: 14 (ref 5–15)
BUN: 12 mg/dL (ref 8–23)
CO2: 23 mmol/L (ref 22–32)
Calcium: 9 mg/dL (ref 8.9–10.3)
Chloride: 101 mmol/L (ref 98–111)
Creatinine, Ser: 0.98 mg/dL (ref 0.44–1.00)
GFR calc Af Amer: >60 mL/min (ref >60)
GFR calc non Af Amer: 52 mL/min — ABNORMAL LOW (ref >60)
Glucose, Bld: 185 mg/dL — ABNORMAL HIGH (ref 70–99)
Potassium: 3.1 mmol/L — ABNORMAL LOW (ref 3.5–5.1)
Sodium: 138 mmol/L (ref 135–145)

## 2019-12-22 LAB — TSH: TSH: 3.237 u[IU]/mL (ref 0.350–4.500)

## 2019-12-22 LAB — BRAIN NATRIURETIC PEPTIDE: B Natriuretic Peptide: 727.6 pg/mL — ABNORMAL HIGH (ref 0.0–100.0)

## 2019-12-22 LAB — RESPIRATORY PANEL BY RT PCR (FLU A&B, COVID)
Influenza A by PCR: NEGATIVE
Influenza B by PCR: NEGATIVE
SARS Coronavirus 2 by RT PCR: NEGATIVE

## 2019-12-22 LAB — MAGNESIUM: Magnesium: 1.5 mg/dL — ABNORMAL LOW (ref 1.7–2.4)

## 2019-12-22 MED ORDER — FUROSEMIDE 10 MG/ML IJ SOLN
40.0000 mg | Freq: Two times a day (BID) | INTRAMUSCULAR | Status: DC
  Administered 2019-12-22 – 2019-12-24 (×4): 40 mg via INTRAVENOUS
  Filled 2019-12-22 (×4): qty 4

## 2019-12-22 MED ORDER — AMIODARONE HCL 200 MG PO TABS
200.0000 mg | ORAL_TABLET | Freq: Every day | ORAL | Status: DC
  Administered 2019-12-22 – 2019-12-24 (×3): 200 mg via ORAL
  Filled 2019-12-22 (×3): qty 1

## 2019-12-22 MED ORDER — GABAPENTIN 300 MG PO CAPS
300.0000 mg | ORAL_CAPSULE | Freq: Every day | ORAL | Status: DC
  Administered 2019-12-22 – 2019-12-23 (×2): 300 mg via ORAL
  Filled 2019-12-22 (×2): qty 1

## 2019-12-22 MED ORDER — ENOXAPARIN SODIUM 40 MG/0.4ML ~~LOC~~ SOLN: 40.0000 mg | SUBCUTANEOUS | Status: DC

## 2019-12-22 MED ORDER — TIZANIDINE HCL 4 MG PO TABS
2.0000 mg | ORAL_TABLET | Freq: Every morning | ORAL | Status: DC
  Administered 2019-12-22 – 2019-12-24 (×3): 2 mg via ORAL
  Filled 2019-12-22 (×3): qty 1

## 2019-12-22 MED ORDER — DOCUSATE SODIUM 100 MG PO CAPS
100.0000 mg | ORAL_CAPSULE | ORAL | Status: DC
  Administered 2019-12-23: 100 mg via ORAL
  Filled 2019-12-22 (×2): qty 1

## 2019-12-22 MED ORDER — POTASSIUM CHLORIDE 20 MEQ PO PACK
40.0000 meq | PACK | Freq: Two times a day (BID) | ORAL | Status: DC
  Administered 2019-12-22 – 2019-12-24 (×5): 40 meq via ORAL
  Filled 2019-12-22 (×5): qty 2

## 2019-12-22 MED ORDER — SODIUM CHLORIDE 0.9% FLUSH: 3.0000 mL | INTRAVENOUS | Status: DC | PRN

## 2019-12-22 MED ORDER — FUROSEMIDE 10 MG/ML IJ SOLN
40.0000 mg | Freq: Once | INTRAMUSCULAR | Status: AC
  Administered 2019-12-22: 40 mg via INTRAVENOUS
  Filled 2019-12-22: qty 4

## 2019-12-22 MED ORDER — DILTIAZEM LOAD VIA INFUSION: 10.0000 mg | Freq: Once | INTRAVENOUS | Status: DC

## 2019-12-22 MED ORDER — DILTIAZEM HCL 25 MG/5ML IV SOLN
10.0000 mg | Freq: Once | INTRAVENOUS | Status: AC
  Administered 2019-12-22: 10 mg via INTRAVENOUS
  Filled 2019-12-22: qty 5

## 2019-12-22 MED ORDER — METOPROLOL TARTRATE 12.5 MG HALF TABLET: 12.5000 mg | ORAL_TABLET | Freq: Once | ORAL | Status: DC

## 2019-12-22 MED ORDER — ACETAMINOPHEN 325 MG PO TABS
650.0000 mg | ORAL_TABLET | ORAL | Status: DC | PRN
  Administered 2019-12-22: 650 mg via ORAL
  Filled 2019-12-22 (×2): qty 2

## 2019-12-22 MED ORDER — DULOXETINE HCL 60 MG PO CPEP
60.0000 mg | ORAL_CAPSULE | Freq: Every morning | ORAL | Status: DC
  Administered 2019-12-22 – 2019-12-24 (×3): 60 mg via ORAL
  Filled 2019-12-22 (×3): qty 1

## 2019-12-22 MED ORDER — MECLIZINE HCL 25 MG PO TABS: 12.5000 mg | ORAL_TABLET | Freq: Two times a day (BID) | ORAL | Status: DC | PRN

## 2019-12-22 MED ORDER — ONDANSETRON HCL 4 MG/2ML IJ SOLN: 4.0000 mg | Freq: Four times a day (QID) | INTRAMUSCULAR | Status: DC | PRN

## 2019-12-22 MED ORDER — GABAPENTIN 100 MG PO CAPS
100.0000 mg | ORAL_CAPSULE | ORAL | Status: DC
  Administered 2019-12-22 – 2019-12-24 (×5): 100 mg via ORAL
  Filled 2019-12-22 (×5): qty 1

## 2019-12-22 MED ORDER — RIVAROXABAN 15 MG PO TABS
15.0000 mg | ORAL_TABLET | Freq: Every morning | ORAL | Status: DC
  Administered 2019-12-22 – 2019-12-23 (×2): 15 mg via ORAL
  Filled 2019-12-22 (×2): qty 1

## 2019-12-22 MED ORDER — SODIUM CHLORIDE 0.9% FLUSH
3.0000 mL | Freq: Two times a day (BID) | INTRAVENOUS | Status: DC
  Administered 2019-12-22 – 2019-12-24 (×5): 3 mL via INTRAVENOUS

## 2019-12-22 MED ORDER — TAMSULOSIN HCL 0.4 MG PO CAPS
0.4000 mg | ORAL_CAPSULE | Freq: Every morning | ORAL | Status: DC
  Administered 2019-12-22 – 2019-12-24 (×3): 0.4 mg via ORAL
  Filled 2019-12-22 (×3): qty 1

## 2019-12-22 MED ORDER — ATORVASTATIN CALCIUM 10 MG PO TABS
10.0000 mg | ORAL_TABLET | Freq: Every morning | ORAL | Status: DC
  Administered 2019-12-22 – 2019-12-24 (×3): 10 mg via ORAL
  Filled 2019-12-22 (×3): qty 1

## 2019-12-22 MED ORDER — SUMATRIPTAN SUCCINATE 50 MG PO TABS
50.0000 mg | ORAL_TABLET | ORAL | Status: DC | PRN
  Filled 2019-12-22 (×2): qty 1

## 2019-12-22 MED ORDER — SODIUM CHLORIDE 0.9 % IV SOLN: 250.0000 mL | INTRAVENOUS | Status: DC | PRN

## 2019-12-22 MED ORDER — METOPROLOL TARTRATE 12.5 MG HALF TABLET
12.5000 mg | ORAL_TABLET | Freq: Once | ORAL | Status: AC
  Administered 2019-12-22: 12.5 mg via ORAL
  Filled 2019-12-22: qty 1

## 2019-12-22 MED ORDER — PANTOPRAZOLE SODIUM 40 MG PO TBEC
40.0000 mg | DELAYED_RELEASE_TABLET | Freq: Every morning | ORAL | Status: DC
  Administered 2019-12-22 – 2019-12-24 (×3): 40 mg via ORAL
  Filled 2019-12-22 (×3): qty 1

## 2019-12-22 MED ORDER — ASPIRIN 81 MG PO CHEW
81.0000 mg | CHEWABLE_TABLET | Freq: Every morning | ORAL | Status: DC
  Administered 2019-12-22 – 2019-12-24 (×3): 81 mg via ORAL
  Filled 2019-12-22 (×3): qty 1

## 2019-12-22 NOTE — ED Provider Notes (Signed)
[facility] EMERGENCY DEPARTMENT Provider Note   CSN: [id number] Arrival date & time: 12/22/19  6314     History Chief Complaint  Patient presents with  . SOB / Afib   Level 5 caveat due to dementia and hard of hearing Lindsey [name] is a 86 y.o. female.  The history is provided by the patient. The history is limited by the condition of the patient.  Shortness of Breath Severity:  Moderate Onset quality:  Sudden Timing:  Constant Progression:  Unchanged Chronicity:  New Relieved by:  None tried Worsened by:  Nothing Patient with history of COPD, hypertension, hyperlipidemia, atrial fibrillation presents with shortness of breath.  Patient stays in a nursing home.  Apparently she woke up short of breath and EMS was called.  They noted the patient was in atrial fibrillation with rapid ventricular rate in the 160s.  Patient was given Cardizem  Patient is unable to participate in history due to dementia as well as hard of hearing.     Past Medical History:  Diagnosis Date  . COPD (chronic obstructive pulmonary disease) (HCC)   . Hyperlipidemia   . Hypertension   . Paroxysmal A-fib (HCC)   . Vertigo     Patient Active Problem List   Diagnosis Date Noted  . A-fib (HCC) 11/27/2019  . Palliative care by specialist   . Goals of care, counseling/discussion   . Terminal care   . Sepsis (HCC)   . COPD with acute exacerbation (HCC) 11/25/2019  . Chest pain 11/23/2019  . Acute CHF (congestive heart failure) (HCC) 11/23/2019  . Unspecified atrial fibrillation (HCC) 11/23/2019  . Chronic anticoagulation 11/23/2019  . Acute lower UTI 11/23/2019  . DNR (do not resuscitate) 11/23/2019    History reviewed. No pertinent surgical history.   OB History   No obstetric history on file.     No family history on file.  Social History   Tobacco Use  . Smoking status: Never Smoker  . Smokeless tobacco: Never Used  Substance Use Topics  . Alcohol use: Never   . Drug use: Never    Home Medications Prior to Admission medications   Medication Sig Start Date End Date Taking? Authorizing Provider  amiodarone (PACERONE) 200 MG tablet Take 1 tablet (200 mg total) by mouth daily. 11/27/19   Calvert Cantor, MD  aspirin 81 MG chewable tablet Chew 81 mg by mouth every morning.     [provider]  atorvastatin (LIPITOR) 10 MG tablet Take 10 mg by mouth every morning.     [provider]  diclofenac Sodium (VOLTAREN) 1 % GEL Apply 2 g topically daily as needed (pain).    [provider]  docusate sodium (COLACE) 100 MG capsule Take 100 mg by mouth every other day.    [provider]  DULoxetine (CYMBALTA) 60 MG capsule Take 60 mg by mouth every morning.     [provider]  furosemide (LASIX) 40 MG tablet Take 40 mg by mouth every morning.     [provider]  gabapentin (NEURONTIN) 100 MG capsule Take 100 mg by mouth 3 (three) times daily. 10a, 4p, 10p    [provider]  meclizine (ANTIVERT) 12.5 MG tablet Take 12.5 mg by mouth See admin instructions. Take one tablet (12.5 mg) by mouth daily at 8am, may also take one tablet (12.5 mg) every 12 hours as needed for dizziness - give 2nd dose at least 4 hours are morning scheduled dose  [provider]  Multiple Vitamin (MULTIVITAMIN WITH MINERALS) TABS tablet Take 1 tablet by mouth every morning.     [provider]  pantoprazole (PROTONIX) 40 MG tablet Take 40 mg by mouth every morning.     [provider]  potassium chloride (KLOR-CON) 10 MEQ tablet Take 10 mEq by mouth every Monday, Wednesday, and Friday.    [provider]  rivaroxaban (XARELTO) 20 MG TABS tablet Take 20 mg by mouth every morning.     [provider]  SUMAtriptan (IMITREX) 50 MG tablet Take 50 mg by mouth every 2 (two) hours as needed for migraine.     [provider]  tamsulosin (FLOMAX) 0.4 MG CAPS capsule Take 0.4 mg by mouth  every morning.     [provider]  tiZANidine (ZANAFLEX) 2 MG tablet Take 2 mg by mouth every morning.     [provider]    Allergies    Diazepam, Drug ingredient [monosodium glutamate], Iodinated diagnostic agents, Nitrofurantoin, Pineapple, Savella [milnacipran], Shellfish allergy, Sulfa antibiotics, and Tramadol  Review of Systems   Review of Systems  Unable to perform ROS: Dementia  Respiratory: Positive for shortness of breath.     Physical Exam Updated Vital Signs BP 132/82 (BP Location: Left Arm)   Pulse (!) 115   Temp 98.4 F (36.9 C) (Temporal)   Resp (!) 22   Ht 1.702 m (5\' 7" )   Wt 75 kg   SpO2 100%   BMI 25.90 kg/m   Physical Exam CONSTITUTIONAL: Elderly, no acute distress HEAD: Normocephalic/atraumatic EYES: EOMI ENMT: Mucous membranes moist NECK: supple no meningeal signs SPINE/BACK:entire spine nontender CV: Tachycardic, irregular LUNGS: Crackles bilaterally, mild tachypnea ABDOMEN: soft, nontender, no rebound or guarding, bowel sounds noted throughout abdomen GU:no cva tenderness NEURO: Pt is awake/alert moves all extremitiesx4.  No facial droop.   EXTREMITIES: pulses normal/equal, full ROM, edema noted to lower extremities SKIN: warm, color normal PSYCH: unable to assess  ED Results / Procedures / Treatments   Labs (all labs ordered are listed, but only abnormal results are displayed) Labs Reviewed  BASIC METABOLIC PANEL - Abnormal; Notable for the following components:      Result Value   Potassium 3.1 (*)    Glucose, Bld 185 (*)    GFR calc non Af Amer 52 (*)    All other components within normal limits  CBC WITH DIFFERENTIAL/PLATELET - Abnormal; Notable for the following components:   RBC 3.75 (*)    Hemoglobin 10.3 (*)    HCT 33.6 (*)    All other components within normal limits  BRAIN NATRIURETIC PEPTIDE - Abnormal; Notable for the following components:   B Natriuretic Peptide 727.6 (*)    All other components  within normal limits  RESPIRATORY PANEL BY RT PCR (FLU A&B, COVID)    EKG ED ECG REPORT   Date: 12/22/2019 0517am  Rate: 103  Rhythm: atrial fibrillation  QRS Axis: left  Intervals: normal  ST/T Wave abnormalities: nonspecific ST changes  Conduction Disutrbances:none  Narrative Interpretation:   Old EKG Reviewed: unchanged  I have personally reviewed the EKG tracing and agree with the computerized printout as noted.  Radiology DG Chest Port 1 View  Result Date: 12/22/2019 CLINICAL DATA:  86 year old female with shortness of breath this morning with tachycardia being treated with IV Cardizem. EXAM: PORTABLE CHEST 1 VIEW COMPARISON:  Portable chest 11/27/2019 and earlier. FINDINGS: Portable AP upright view at 0528 hours. Stable cardiomegaly and left lung base hypo  ventilation. Small bilateral pleural effusions demonstrated by CT in August likely persist, and are more apparent than on 11/27/2019. Interval increased pulmonary vascularity compatible with mild interstitial edema. No pneumothorax. Visualized tracheal air column is within normal limits. Negative visible bowel gas pattern. No acute osseous abnormality identified. IMPRESSION: 1. Mild acute pulmonary interstitial edema. 2. Small bilateral pleural effusions persist, and may have increased since 11/27/2019. 3. Stable cardiomegaly and chronic hypo ventilation at the left lung base. Electronically Signed   By: Odessa Fleming M.D.   On: 12/22/2019 05:39    Procedures Procedures    Medications Ordered in ED Medications  furosemide (LASIX) injection 40 mg (40 mg Intravenous Given 12/22/19 6389)    ED Course  I have reviewed the triage vital signs and the nursing notes.  Pertinent labs & imaging results that were available during my care of the patient were reviewed by me and considered in my medical decision making (see chart for details).    MDM Rules/Calculators/A&P                          5:31 AM Patient presents from nursing  facility for shortness of breath and atrial fibrillation.  I reviewed the chart, patient was recently seen by palliative care and was placed on hospice.  She has not wanted interventions such as cardioversion.  Her heart rate has responded well to Cardizem.  Patient has asked me to contact her daughter but I had to leave a message on the phone 6:15 AM Discussed the case with hospice nurse, who will see the patient in the hospital.  I have updated patient on plan.  Patient will remain a DNR, but will provide diuresis. Patient is on oxygen chronically at the nursing facility 6:57 AM Discussed with Dr. Julian Reil with hospitalist for admission Final Clinical Impression(s) / ED Diagnoses Final diagnoses:  Paroxysmal atrial fibrillation (HCC)  Acute systolic congestive heart failure [facility])    Rx / DC Orders ED Discharge Orders    None       Zadie Rhine, MD 12/22/19 [phone]

## 2019-12-22 NOTE — ED Triage Notes (Addendum)
Patient arrived with EMS from [facility] reports SOB this morning , received Cardizem 10 mg IV for HR= 160's/Afib by EMS . Denies chest pain .

## 2019-12-22 NOTE — Progress Notes (Signed)
   12/22/19 1659  Assess: MEWS Score  Temp 99 F (37.2 C)  BP (!) 139/92  Pulse Rate (!) 128  Resp 16  Level of Consciousness Alert  SpO2 97 %  O2 Device Nasal Cannula  O2 Flow Rate (L/min) 2 L/min  Assess: MEWS Score  MEWS Temp 0  MEWS Systolic 0  MEWS Pulse 2  MEWS RR 0  MEWS LOC 0  MEWS Score 2  MEWS Score Color Yellow  Assess: if the MEWS score is Yellow or Red  Were vital signs taken at a resting state? Yes  Focused Assessment No change from prior assessment  Early Detection of Sepsis Score *See Row Information* Low  MEWS guidelines implemented *See Row Information* Yes  Treat  MEWS Interventions Administered scheduled meds/treatments  Pain Scale 0-10  Pain Score 0  Complains of Shortness of breath  Interventions Medication (see MAR)  Take Vital Signs  Increase Vital Sign Frequency  Yellow: Q 2hr X 2 then Q 4hr X 2, if remains yellow, continue Q 4hrs  Escalate  MEWS: Escalate Yellow: discuss with charge nurse/RN and consider discussing with provider and RRT  Notify: Charge Nurse/RN  Name of Charge Nurse/RN Notified Delorise Shiner RN  Date Charge Nurse/RN Notified 12/22/19  Time Charge Nurse/RN Notified 1730  Notify: Provider  Provider Name/Title Pahwani MD  Date Provider Notified 12/22/19  Time Provider Notified 1723  Notification Type Call  Notification Reason Other (Comment)  Response See new orders  Date of Provider Response 12/22/19  Time of Provider Response 1723  Document  Patient Outcome Other (Comment) (Will give cardizem once pharmacy approves.)    Patient has arrived onto the unit from the ED with HR's trending in 120-140s. Patient is asymptomatic. MD paged and notified. MD ordered one time dose of PO cardizem. Consulting civil engineer notified. Will continue to monitor.

## 2019-12-22 NOTE — Progress Notes (Addendum)
[facility])  Lindsey [name] is currently under [facility] hospice care with a CTI of Hypertensive Heart Disease. She presents to the ED with worsening shortness of breath, associated with leg swelling, orthopnea, PND however denies association with chest pain, palpitation, fever, chills, productive cough, nausea, vomiting, abdominal pain, urinary or bowel changes. Upon arrival to EMS patient was noted to be in A. fib with RVR with heart rate in 160s-Cardizem 10 mg IV was given by EMS prior to arrival to ED. This is a related admission per [facility] MD.   Sherron Monday with daughter Lindsey [name] about plan of care and she stated her mom will be admitted for further work up and discharged in 2-3 days back to [facility]. Report exchanged with the hospital team.   VS: 138/94, 115 HR, 22RR, O2 97RA I&O: 0/300 Labs: Potassium: 3.1 (L) Glucose: 185 (H) GFR, Est Non African American: 52 (L) GFR, Est African American: >60 B Natriuretic Peptide: 727.6 (H) RBC: 3.75 (L) Hemoglobin: 10.3 (L) HCT: 33.6 (L)   Diagnostics: IMPRESSION: 1. Mild acute pulmonary interstitial edema. 2. Small bilateral pleural effusions persist, and may have increased since 11/27/2019. 3. Stable cardiomegaly and chronic hypo ventilation at the left lung base.   Electronically Signed   By: Odessa Fleming M.D.   On: 12/22/2019 05:39  IV/PRN Meds: Lasix Scheduled 40mg  IV, PRN meds: Meclizine 12.5 BID PO, Zofran 4mg  IV Q6hr.  Problem List:  Principal Problem:   Acute on chronic systolic CHF (congestive heart failure) (HCC) Active Problems:   Paroxysmal A-fib (HCC)   Hyperlipidemia   Normocytic anemia   Hypokalemia    Acute hypoxemic respiratory failure secondary to acute on chronic systolic CHF: -Patient presented with signs and symptoms of fluid overload.  Shortness of breath, leg swelling, orthopnea, PND.  Bilateral lower extremity swelling noted.  BNP elevated at 727, chest x-ray noted.  COVID-19 negative.  Patient  received Lasix 40 IV once in ED.  Patient is afebrile with no leukocytosis requiring 4 L of oxygen via nasal cannula. -Admit patient on the floor.  On telemetry.  On continuous pulse ox.  We will try to wean off of oxygen as tolerated.   -Start Lasix 40 IV twice daily.  Strict INO's and daily weight.  Monitor electrolytes closely.  Check magnesium. -Keep magnesium more than 2, potassium more than 4 -Reviewed echo from 8/21 which showed ejection fraction of 40 to 45%, LVH of basal septal segment.  Mild MR, mild AR -Monitor vitals closely.  Paroxysmal A. Fib: -Reviewed EKG.  Continue amiodarone, Xarelto. -Patient received Cardizem 10 mg IV given by EMS prior to arrival.  Hypokalemia: Replenished.  Check magnesium level.  Repeat BMP tomorrow a.m.  GERD: Continue PPI  Hyperlipidemia: Continue statin  Normocytic anemia: H&H slightly down than baseline.  No signs of active bleeding.  Continue to monitor.  Transfuse as needed.  D/C Planning: D/C back to [facility] in 2-3 days once stable Family Contact: Spoke with 9/21. Updated and no concerns. IDG: Updated GOC: Clear. Discussed goals and DNR status is in place now   [facility], BSN, [facility] (In [place]) [phone]

## 2019-12-22 NOTE — H&P (Signed)
History and Physical    Lindsey [name] FOY:[id number] DOB: [birth year] DOA: 12/22/2019  PCP: Patient, No Pcp Per  Patient coming from:  [facility] [facility]  I have personally briefly reviewed patient's old medical records in [facility]  Chief Complaint: Shortness of breath and leg swelling since 3 days  HPI: Lindsey [name] is a 86 y.o. female with medical history significant of hypertension, hyperlipidemia, paroxysmal A. fib-on Xarelto, COPD, chronic CHF with ejection fraction of 40 to 45% presents to emergency department for evaluation of shortness of breath and leg swelling since 3 days.  Patient tells me that she started having worsening shortness of breath, associated with leg swelling, orthopnea, PND however denies association with chest pain, palpitation, fever, chills, productive cough, nausea, vomiting, abdominal pain, urinary or bowel changes.  Reports being compliant with home medications as well as low-sodium diet.  No history of smoking, alcohol, illicit drug use.  Upon arrival to EMS patient was noted to be in A. fib with RVR with heart rate in 160s-Cardizem 10 mg IV was given by EMS prior to arrival to ED.  ED Course: Upon arrival to ED: Patient tachycardic, tachypneic, hypoxic requiring 4 L of oxygen via nasal cannula, afebrile with no leukocytosis, BMP shows potassium of 3.1, BNP: 727, COVID-19 negative, chest x-ray shows: Mild acute pulmonary interstitial edema.  Small bilateral pleural effusion persist and may have increased since 11/27/2019.  Stable cardiomegaly.  Chronic hypoventilation of left lung base.  Patient was given Lasix 40 IV once in ED.  [facility] hospitalist consulted for admission due to acute on chronic systolic CHF.  Review of Systems: As per HPI otherwise negative.    Past Medical History:  Diagnosis Date  . COPD (chronic obstructive pulmonary disease) (HCC)   . Hyperlipidemia   . Hypertension   . Paroxysmal A-fib (HCC)   . Vertigo      History reviewed. No pertinent surgical history.   reports that she has never smoked. She has never used smokeless tobacco. She reports that she does not drink alcohol and does not use drugs.  Allergies  Allergen Reactions  . Diazepam     Unknown reaction  . Drug Ingredient [Monosodium Glutamate]     MSG - Unknown reaction  . Iodinated Diagnostic Agents     Unknown reaction  . Nitrofurantoin     Unknown reaction  . Pineapple     Unknown reaction  . Savella [Milnacipran]     Unknown reaction  . Shellfish Allergy     Unknown reaction  . Sulfa Antibiotics     Unknown reaction  . Tramadol     Unknown reaction    No family history on file.  Prior to Admission medications   Medication Sig Start Date End Date Taking? Authorizing Provider  amiodarone (PACERONE) 200 MG tablet Take 1 tablet (200 mg total) by mouth daily. 11/27/19   Calvert Cantor, MD  aspirin 81 MG chewable tablet Chew 81 mg by mouth every morning.     [provider]  atorvastatin (LIPITOR) 10 MG tablet Take 10 mg by mouth every morning.     [provider]  diclofenac Sodium (VOLTAREN) 1 % GEL Apply 2 g topically daily as needed (pain).    [provider]  docusate sodium (COLACE) 100 MG capsule Take 100 mg by mouth every other day.    [provider]  DULoxetine (CYMBALTA) 60 MG capsule Take 60 mg by mouth every morning.     [provider]  furosemide (LASIX) 40 MG tablet Take 40 mg by mouth every morning.     [provider]  gabapentin (NEURONTIN) 100 MG capsule Take 100 mg by mouth 3 (three) times daily. 10a, 4p, 10p    [provider]  meclizine (ANTIVERT) 12.5 MG tablet Take 12.5 mg by mouth See admin instructions. Take one tablet (12.5 mg) by mouth daily at 8am, may also take one tablet (12.5 mg) every 12 hours as needed for dizziness - give 2nd dose at least 4 hours are morning scheduled dose    [provider]  Multiple Vitamin  (MULTIVITAMIN WITH MINERALS) TABS tablet Take 1 tablet by mouth every morning.     [provider]  pantoprazole (PROTONIX) 40 MG tablet Take 40 mg by mouth every morning.     [provider]  potassium chloride (KLOR-CON) 10 MEQ tablet Take 10 mEq by mouth every Monday, Wednesday, and Friday.    [provider]  rivaroxaban (XARELTO) 20 MG TABS tablet Take 20 mg by mouth every morning.     [provider]  SUMAtriptan (IMITREX) 50 MG tablet Take 50 mg by mouth every 2 (two) hours as needed for migraine.     [provider]  tamsulosin (FLOMAX) 0.4 MG CAPS capsule Take 0.4 mg by mouth every morning.     [provider]  tiZANidine (ZANAFLEX) 2 MG tablet Take 2 mg by mouth every morning.     [provider]    Physical Exam: Vitals:   12/22/19 0600 12/22/19 0630 12/22/19 0721 12/22/19 0725  BP: 135/68 (!) 132/99 (!) 147/87   Pulse: (!) 112 (!) 113  (!) 112  Resp: (!) 23 (!) 26  (!) 22  Temp:      TempSrc:      SpO2: 100% 100%  97%  Weight:      Height:        Constitutional: NAD, calm, comfortable, on 4 L of oxygen via nasal cannula, sitting comfortably on the bed.  Communicating well.  Hard of hearing. Eyes: PERRL, lids and conjunctivae normal ENMT: Mucous membranes are moist. Posterior pharynx clear of any exudate or lesions.Normal dentition.  Neck: normal, supple, no masses, no thyromegaly Respiratory: Tachypneic, crackles noted on the bases.  No wheezing. Cardiovascular: Tachycardic, no murmurs / rubs / gallops.  3+ bilateral pitting edema positive. 2+ pedal pulses. No carotid bruits.  Abdomen: no tenderness, no masses palpated. No hepatosplenomegaly. Bowel sounds positive.  Musculoskeletal: no clubbing / cyanosis. No joint deformity upper and lower extremities. Good ROM, no contractures. Normal muscle tone.  Skin: no rashes, lesions, ulcers. No induration Neurologic: CN 2-12 grossly intact. Sensation intact, DTR normal.  Strength 5/5 in all 4.  Psychiatric: Normal judgment and insight. Alert and oriented x 3. Normal mood.    Labs on Admission: I have personally reviewed following labs and imaging studies  CBC: Recent Labs  Lab 12/22/19 0529  WBC 8.5  NEUTROABS 5.6  HGB 10.3*  HCT 33.6*  MCV 89.6  PLT 387   Basic Metabolic Panel: Recent Labs  Lab 12/22/19 0529  NA 138  K 3.1*  CL 101  CO2 23  GLUCOSE 185*  BUN 12  CREATININE 0.98  CALCIUM 9.0   GFR: Estimated Creatinine Clearance: 42.8 mL/min (by C-G formula based on SCr of 0.98 mg/dL). Liver Function Tests: No results for input(s): AST, ALT, ALKPHOS, BILITOT, PROT, ALBUMIN in the last 168 hours. No results for input(s): LIPASE, AMYLASE in the last 168 hours.  No results for input(s): AMMONIA in the last 168 hours. Coagulation Profile: No results for input(s): INR, PROTIME in the last 168 hours. Cardiac Enzymes: No results for input(s): CKTOTAL, CKMB, CKMBINDEX, TROPONINI in the last 168 hours. BNP (last 3 results) No results for input(s): PROBNP in the last 8760 hours. HbA1C: No results for input(s): HGBA1C in the last 72 hours. CBG: No results for input(s): GLUCAP in the last 168 hours. Lipid Profile: No results for input(s): CHOL, HDL, LDLCALC, TRIG, CHOLHDL, LDLDIRECT in the last 72 hours. Thyroid Function Tests: No results for input(s): TSH, T4TOTAL, FREET4, T3FREE, THYROIDAB in the last 72 hours. Anemia Panel: No results for input(s): VITAMINB12, FOLATE, FERRITIN, TIBC, IRON, RETICCTPCT in the last 72 hours. Urine analysis:    Component Value Date/Time   COLORURINE YELLOW 11/23/2019 0630   APPEARANCEUR HAZY (A) 11/23/2019 0630   LABSPEC 1.014 11/23/2019 0630   PHURINE 5.0 11/23/2019 0630   GLUCOSEU NEGATIVE 11/23/2019 0630   HGBUR NEGATIVE 11/23/2019 0630   BILIRUBINUR NEGATIVE 11/23/2019 0630   KETONESUR NEGATIVE 11/23/2019 0630   PROTEINUR NEGATIVE 11/23/2019 0630   NITRITE NEGATIVE 11/23/2019 0630   LEUKOCYTESUR  NEGATIVE 11/23/2019 0630    Radiological Exams on Admission: DG Chest Port 1 View  Result Date: 12/22/2019 CLINICAL DATA:  86 year old female with shortness of breath this morning with tachycardia being treated with IV Cardizem. EXAM: PORTABLE CHEST 1 VIEW COMPARISON:  Portable chest 11/27/2019 and earlier. FINDINGS: Portable AP upright view at 0528 hours. Stable cardiomegaly and left lung base hypo ventilation. Small bilateral pleural effusions demonstrated by CT in August likely persist, and are more apparent than on 11/27/2019. Interval increased pulmonary vascularity compatible with mild interstitial edema. No pneumothorax. Visualized tracheal air column is within normal limits. Negative visible bowel gas pattern. No acute osseous abnormality identified. IMPRESSION: 1. Mild acute pulmonary interstitial edema. 2. Small bilateral pleural effusions persist, and may have increased since 11/27/2019. 3. Stable cardiomegaly and chronic hypo ventilation at the left lung base. Electronically Signed   By: Odessa Fleming M.D.   On: 12/22/2019 05:39    EKG: Independently reviewed.  A. fib with heart rate in 103.  Left axis deviation. NO ST-T wave changes.  Assessment/Plan Principal Problem:   Acute on chronic systolic CHF (congestive heart failure) (HCC) Active Problems:   Paroxysmal A-fib (HCC)   Hyperlipidemia   Normocytic anemia   Hypokalemia    Acute hypoxemic respiratory failure secondary to acute on chronic systolic CHF: -Patient presented with signs and symptoms of fluid overload.  Shortness of breath, leg swelling, orthopnea, PND.  Bilateral lower extremity swelling noted.  BNP elevated at 727, chest x-ray noted.  COVID-19 negative.  Patient received Lasix 40 IV once in ED.  Patient is afebrile with no leukocytosis requiring 4 L of oxygen via nasal cannula. -Admit patient on the floor.  On telemetry.  On continuous pulse ox.  We will try to wean off of oxygen as tolerated.   -Start Lasix 40 IV twice  daily.  Strict INO's and daily weight.  Monitor electrolytes closely.  Check magnesium. -Keep magnesium more than 2, potassium more than 4 -Reviewed echo from 8/21 which showed ejection fraction of 40 to 45%, LVH of basal septal segment.  Mild MR, mild AR -Monitor vitals closely.  Paroxysmal A. Fib: -Reviewed EKG.  Continue amiodarone, Xarelto. -Patient received Cardizem 10 mg IV given by EMS prior to arrival.  Hypokalemia: Replenished.  Check magnesium level.  Repeat BMP tomorrow a.m.  GERD: Continue PPI  Hyperlipidemia: Continue statin  Normocytic anemia: H&H slightly down than baseline.  No signs of active bleeding.  Continue to monitor.  Transfuse as needed.  DVT prophylaxis: Xarelto/SCD Code Status: DNR-confirmed with the patient Family Communication: None present at bedside.  Plan of care discussed with patient in length and she verbalized understanding and agreed with it. Disposition Plan: SNF in 2 to 3 days Consults called: None Admission status: Inpatient   Ollen Bowlinka R Faheem Ziemann MD [facility]  If 7PM-7AM, please contact night-coverage [email or web address] Password [email or web address]  12/22/2019, 7:44 AM

## 2019-12-22 NOTE — Progress Notes (Signed)
°   12/22/19 2021  Assess: MEWS Score  Temp 99.1 F (37.3 C)  BP (!) 152/86  Pulse Rate (!) 132  Resp 18  SpO2 97 %  O2 Device Nasal Cannula  Assess: MEWS Score  MEWS Temp 0  MEWS Systolic 0  MEWS Pulse 3  MEWS RR 0  MEWS LOC 0  MEWS Score 3  MEWS Score Color Yellow  Assess: if the MEWS score is Yellow or Red  Were vital signs taken at a resting state? Yes  Focused Assessment No change from prior assessment  Early Detection of Sepsis Score *See Row Information* Low  MEWS guidelines implemented *See Row Information* No, previously yellow, continue vital signs every 4 hours  Treat  MEWS Interventions Other (Comment) (paged MD per orders)  Notify: Charge Nurse/RN  Name of Charge Nurse/RN Notified Jequetta RN  Date Charge Nurse/RN Notified 12/22/19  Time Charge Nurse/RN Notified 2030  Notify: Provider  Provider Name/Title Toniann Fail MD  Date Provider Notified 12/22/19  Time Provider Notified 2054  Notification Type Page  Notification Reason Change in status  Response See new orders  Date of Provider Response 12/22/19  Time of Provider Response 2055  Document  Patient Outcome Other (Comment) (see note)  Progress note created (see row info) Yes   Patients heart rate elevated to 120s-130s. Notified MD. Orders placed for chest Xray and IV Lasix.   2320 Re-notified MD due to continued increased HR. Orders placed for Ddimer and metoprolol.

## 2019-12-23 DIAGNOSIS — I5023 Acute on chronic systolic (congestive) heart failure: Secondary | ICD-10-CM

## 2019-12-23 DIAGNOSIS — I480 Paroxysmal atrial fibrillation: Secondary | ICD-10-CM

## 2019-12-23 DIAGNOSIS — E876 Hypokalemia: Secondary | ICD-10-CM

## 2019-12-23 LAB — BASIC METABOLIC PANEL
Anion gap: 11 (ref 5–15)
BUN: 11 mg/dL (ref 8–23)
CO2: 24 mmol/L (ref 22–32)
Calcium: 8.9 mg/dL (ref 8.9–10.3)
Chloride: 102 mmol/L (ref 98–111)
Creatinine, Ser: 0.88 mg/dL (ref 0.44–1.00)
GFR calc Af Amer: >60 mL/min (ref >60)
GFR calc non Af Amer: 59 mL/min — ABNORMAL LOW (ref >60)
Glucose, Bld: 176 mg/dL — ABNORMAL HIGH (ref 70–99)
Potassium: 3.8 mmol/L (ref 3.5–5.1)
Sodium: 137 mmol/L (ref 135–145)

## 2019-12-23 LAB — D-DIMER, QUANTITATIVE: D-Dimer, Quant: 0.93 ug/mL-FEU — ABNORMAL HIGH (ref 0.00–0.50)

## 2019-12-23 MED ORDER — MAGNESIUM SULFATE 2 GM/50ML IV SOLN
2.0000 g | Freq: Once | INTRAVENOUS | Status: AC
  Administered 2019-12-23: 2 g via INTRAVENOUS
  Filled 2019-12-23: qty 50

## 2019-12-23 MED ORDER — METOPROLOL TARTRATE 5 MG/5ML IV SOLN
5.0000 mg | Freq: Once | INTRAVENOUS | Status: AC
  Administered 2019-12-23: 5 mg via INTRAVENOUS
  Filled 2019-12-23: qty 5

## 2019-12-23 MED ORDER — RIVAROXABAN 20 MG PO TABS
20.0000 mg | ORAL_TABLET | Freq: Every morning | ORAL | Status: DC
  Administered 2019-12-24: 20 mg via ORAL
  Filled 2019-12-23: qty 1

## 2019-12-23 MED ORDER — LORAZEPAM 0.5 MG PO TABS: 0.5000 mg | ORAL_TABLET | Freq: Three times a day (TID) | ORAL | Status: DC | PRN

## 2019-12-23 MED ORDER — ALPRAZOLAM 0.25 MG PO TABS
0.2500 mg | ORAL_TABLET | Freq: Once | ORAL | Status: AC
  Administered 2019-12-23: 0.25 mg via ORAL
  Filled 2019-12-23: qty 1

## 2019-12-23 NOTE — Discharge Instructions (Signed)

## 2019-12-23 NOTE — Progress Notes (Signed)
PROGRESS NOTE    Lindsey [name]   JQZ:[id number]  DOB: 30-Mar-1930  DOA: 12/22/2019 PCP: Patient, No Pcp Per   Brief Narrative:  Lindsey [name] is a 86 y.o. female with medical history significant of hypertension, hyperlipidemia, paroxysmal A. fib-on Xarelto, COPD, chronic CHF with ejection fraction of 40 to 45% presents to emergency department for evaluation of shortness of breath and leg swelling for 3 days. She was found to have A-fib with RVR when EMS arrived. Cardizem 10 mg IV was given by EMS  ED Course: Upon arrival to ED: Patient tachycardic, tachypneic, hypoxic requiring 4 L of oxygen via nasal cannula, afebrile with no leukocytosis, BMP shows potassium of 3.1, BNP: 727, COVID-19 negative, chest x-ray shows: Mild acute pulmonary interstitial edema.  Small bilateral pleural effusion persist and may have increased since 11/27/2019.  Stable cardiomegaly.  Chronic hypoventilation of left lung base.  Patient was given Lasix 40 IV once in ED.  [facility] hospitalist consulted for admission due to acute on chronic systolic CHF.  Subjective: Feels short of breath.     Assessment & Plan:   Principal Problem:   Acute on chronic systolic CHF (congestive heart failure) - A-fib with RVR is contributing to this- previously was becoming very bradycardic on B blockers- she declined a discussion with EP about a pacemaker last admission and opted for comfort care and hospice - Sent into hospital by ALF facility at night- family and hospice were not called - ED called daughter but no response- patient has a difficult time communicating because she can barely hear- thus, patient admitted for treatment - cont IV diuretics and rate control for A-fib - I have spoken with hospice today to do a MOST form  Active Problems:   Paroxysmal A-fib  -cont Amiodarone and Xarelto  Anxiety - related to dyspnea- start PRN Ativan    Normocytic anemia - due to chronic disease    Hypokalemia - replaced   Time  spent in minutes: 35 DVT prophylaxis: Xarelto Code Status: DNR Family Communication: daughter Disposition Plan:  Status is: Inpatient  Remains inpatient appropriate because:cont IV diuretics   Dispo: The patient is from: ALF              Anticipated d/c is to: ALF              Anticipated d/c date is: 1 day              Patient currently is not medically stable to d/c.      Consultants:   none Procedures:   none Antimicrobials:  Anti-infectives (From admission, onward)   None       Objective: Vitals:   12/23/19 0334 12/23/19 0734 12/23/19 0822 12/23/19 1209  BP: 107/67 118/86 128/85 109/64  Pulse: 97 99 95 99  Resp: 20 18 20 18   Temp: 98.2 F (36.8 C) 98.1 F (36.7 C) 98 F (36.7 C) 98.1 F (36.7 C)  TempSrc: Oral Oral Oral Oral  SpO2: 97% 96% 98% 99%  Weight: 74.2 kg     Height:        Intake/Output Summary (Last 24 hours) at 12/23/2019 1619 Last data filed at 12/23/2019 1521 Gross per 24 hour  Intake 653 ml  Output 1950 ml  Net -1297 ml   Filed Weights   12/22/19 0514 12/23/19 0334  Weight: 75 kg 74.2 kg    Examination: General exam: Appears comfortable  HEENT: PERRLA, oral mucosa moist, no sclera icterus or thrush Respiratory system: Clear to  auscultation. Respiratory effort normal. Cardiovascular system: S1 & S2 heard, RRR.   Gastrointestinal system: Abdomen soft, non-tender, nondistended. Normal bowel sounds. Central nervous system: Alert and oriented. No focal neurological deficits. Extremities: No cyanosis, clubbing or edema Skin: No rashes or ulcers Psychiatry:  Very anxious    Data Reviewed: I have personally reviewed following labs and imaging studies  CBC: Recent Labs  Lab 12/22/19 0529  WBC 8.5  NEUTROABS 5.6  HGB 10.3*  HCT 33.6*  MCV 89.6  PLT 387   Basic Metabolic Panel: Recent Labs  Lab 12/22/19 0529 12/22/19 0743 12/23/19 0115  NA 138  --  137  K 3.1*  --  3.8  CL 101  --  102  CO2 23  --  24  GLUCOSE 185*   --  176*  BUN 12  --  11  CREATININE 0.98  --  0.88  CALCIUM 9.0  --  8.9  MG  --  1.5*  --    GFR: Estimated Creatinine Clearance: 47.4 mL/min (by C-G formula based on SCr of 0.88 mg/dL). Liver Function Tests: No results for input(s): AST, ALT, ALKPHOS, BILITOT, PROT, ALBUMIN in the last 168 hours. No results for input(s): LIPASE, AMYLASE in the last 168 hours. No results for input(s): AMMONIA in the last 168 hours. Coagulation Profile: No results for input(s): INR, PROTIME in the last 168 hours. Cardiac Enzymes: No results for input(s): CKTOTAL, CKMB, CKMBINDEX, TROPONINI in the last 168 hours. BNP (last 3 results) No results for input(s): PROBNP in the last 8760 hours. HbA1C: No results for input(s): HGBA1C in the last 72 hours. CBG: No results for input(s): GLUCAP in the last 168 hours. Lipid Profile: No results for input(s): CHOL, HDL, LDLCALC, TRIG, CHOLHDL, LDLDIRECT in the last 72 hours. Thyroid Function Tests: Recent Labs    12/22/19 0743  TSH 3.237   Anemia Panel: No results for input(s): VITAMINB12, FOLATE, FERRITIN, TIBC, IRON, RETICCTPCT in the last 72 hours. Urine analysis:    Component Value Date/Time   COLORURINE YELLOW 11/23/2019 0630   APPEARANCEUR HAZY (A) 11/23/2019 0630   LABSPEC 1.014 11/23/2019 0630   PHURINE 5.0 11/23/2019 0630   GLUCOSEU NEGATIVE 11/23/2019 0630   HGBUR NEGATIVE 11/23/2019 0630   BILIRUBINUR NEGATIVE 11/23/2019 0630   KETONESUR NEGATIVE 11/23/2019 0630   PROTEINUR NEGATIVE 11/23/2019 0630   NITRITE NEGATIVE 11/23/2019 0630   LEUKOCYTESUR NEGATIVE 11/23/2019 0630   Sepsis Labs: @LABRCNTIP (procalcitonin:4,lacticidven:4) ) Recent Results (from the past 240 hour(s))  Respiratory Panel by RT PCR (Flu A&B, Covid) - Nasopharyngeal Swab     Status: None   Collection Time: 12/22/19  5:41 AM   Specimen: Nasopharyngeal Swab  Result Value Ref Range Status   SARS Coronavirus 2 by RT PCR NEGATIVE NEGATIVE Final    Comment:  (NOTE) SARS-CoV-2 target nucleic acids are NOT DETECTED.  The SARS-CoV-2 RNA is generally detectable in upper respiratoy specimens during the acute phase of infection. The lowest concentration of SARS-CoV-2 viral copies this assay can detect is 131 copies/mL. A negative result does not preclude SARS-Cov-2 infection and should not be used as the sole basis for treatment or other patient management decisions. A negative result may occur with  improper specimen collection/handling, submission of specimen other than nasopharyngeal swab, presence of viral mutation(s) within the areas targeted by this assay, and inadequate number of viral copies (<131 copies/mL). A negative result must be combined with clinical observations, patient history, and epidemiological information. The expected result is Negative.  Fact Sheet for  Patients:  https://www.moore.com/  Fact Sheet for Healthcare Providers:  https://www.young.biz/  This test is no t yet approved or cleared by the [place] FDA and  has been authorized for detection and/or diagnosis of SARS-CoV-2 by FDA under an Emergency Use Authorization (EUA). This EUA will remain  in effect (meaning this test can be used) for the duration of the COVID-19 declaration under Section 564(b)(1) of the Act, 21 U.S.C. section 360bbb-3(b)(1), unless the authorization is terminated or revoked sooner.     Influenza A by PCR NEGATIVE NEGATIVE Final   Influenza B by PCR NEGATIVE NEGATIVE Final    Comment: (NOTE) The Xpert Xpress SARS-CoV-2/FLU/RSV assay is intended as an aid in  the diagnosis of influenza from Nasopharyngeal swab specimens and  should not be used as a sole basis for treatment. Nasal washings and  aspirates are unacceptable for Xpert Xpress SARS-CoV-2/FLU/RSV  testing.  Fact Sheet for Patients: https://www.moore.com/  Fact Sheet for Healthcare  Providers: https://www.young.biz/  This test is not yet approved or cleared by the [place] FDA and  has been authorized for detection and/or diagnosis of SARS-CoV-2 by  FDA under an Emergency Use Authorization (EUA). This EUA will remain  in effect (meaning this test can be used) for the duration of the  Covid-19 declaration under Section 564(b)(1) of the Act, 21  U.S.C. section 360bbb-3(b)(1), unless the authorization is  terminated or revoked. Performed at [facility], [address], [place], [place] [address]          Radiology Studies: DG CHEST PORT 1 VIEW  Result Date: 12/22/2019 CLINICAL DATA:  Shortness of breath EXAM: PORTABLE CHEST 1 VIEW COMPARISON:  Radiograph 12/22/2019 FINDINGS: Increasing patchy opacities in the right perihilar and infrahilar lung. Small bilateral pleural effusions are again noted. No pneumothorax. Pulmonary vascularity remains congested. Cardiomegaly appearance is unchanged from prior with visible coronary stents. Aorta is calcified. Remaining cardiomediastinal contours are unremarkable. No acute osseous or soft tissue abnormality. Degenerative changes are present in the imaged spine and shoulders. Telemetry leads overlie the chest. IMPRESSION: 1. Likely worsening interstitial and developing alveolar edema though underlying infection is not fully excluded. 2. Stable small bilateral pleural effusions. 3. Stable cardiomegaly and pulmonary vascular congestion. 4.  Aortic Atherosclerosis (ICD10-I70.0). Electronically Signed   By: Kreg Shropshire M.D.   On: 12/22/2019 21:39   DG Chest Port 1 View  Result Date: 12/22/2019 CLINICAL DATA:  86 year old female with shortness of breath this morning with tachycardia being treated with IV Cardizem. EXAM: PORTABLE CHEST 1 VIEW COMPARISON:  Portable chest 11/27/2019 and earlier. FINDINGS: Portable AP upright view at 0528 hours. Stable cardiomegaly and left lung base hypo ventilation. Small  bilateral pleural effusions demonstrated by CT in August likely persist, and are more apparent than on 11/27/2019. Interval increased pulmonary vascularity compatible with mild interstitial edema. No pneumothorax. Visualized tracheal air column is within normal limits. Negative visible bowel gas pattern. No acute osseous abnormality identified. IMPRESSION: 1. Mild acute pulmonary interstitial edema. 2. Small bilateral pleural effusions persist, and may have increased since 11/27/2019. 3. Stable cardiomegaly and chronic hypo ventilation at the left lung base. Electronically Signed   By: Odessa Fleming M.D.   On: 12/22/2019 05:39      Scheduled Meds: . amiodarone  200 mg Oral Daily  . aspirin  81 mg Oral q morning - 10a  . atorvastatin  10 mg Oral q morning - 10a  . docusate sodium  100 mg Oral QODAY  . DULoxetine  60 mg Oral q morning - 10a  . furosemide  40 mg Intravenous BID  . gabapentin  100 mg Oral 2 times per day  . gabapentin  300 mg Oral Q2000  . pantoprazole  40 mg Oral q morning - 10a  . potassium chloride  40 mEq Oral BID  . [START ON 12/24/2019] Rivaroxaban  20 mg Oral q morning - 10a  . sodium chloride flush  3 mL Intravenous Q12H  . tamsulosin  0.4 mg Oral q morning - 10a  . tiZANidine  2 mg Oral q morning - 10a   Continuous Infusions: . sodium chloride       LOS: 1 day      Calvert CantorSaima Charnika Herbst, MD [facility] Pager: [email or web address] 12/23/2019, 4:19 PM

## 2019-12-23 NOTE — Progress Notes (Signed)
MC 3E25 [facility]) Hospitalized Hospice Patient  Lindsey [name] is currently under [facility] hospice care with a terminal diagnosis of hypertensive heart disease. She is a resident at [facility] where they found her to be SOB, tachycardic and anxious. [facility] activated EMS prior to calling hospice or the patients family. Upon arrival to ED, ED staff attempted to contact family but did not receive an answer. She was admitted with acute on chronic systolic heart failure.  This is a related hospital admission.  Family not present, NOK [name] is in quarantine currently. Spoke over the phone, she states she is not sure why the facility sent pt to the ED. [name] is agreeable that the family wants conservative management and would like to keep her at [facility] with hospice for EOL care. Pt confused and anxious. Plan to likely d/c back to [facility] on 9/30.  V/S:  98.1 oral, 109/64, HR 99, RR 18, SPO2 99% on O2 via [place] I&O:  483/1750 Labs: d-dimer 0.93, BNP 727 (on admission) IVs/PRNs: lasix 40 mg IV BID, lopressor 5 mg IV x 1, mag sulfate 2 g IV x 1  Problem List: - acute on chronic systolic HF - lasix 40 mg IV BID, pt diuresing, weaning O2  GOC: clear, facility activated EMS once pt found to be SOB. Family is clear they would prefer her to stay at [facility] and contact hospice if pt needs something. Family: updated IDT: hospice team updated D/C planning: return to [facility] on 9/30. Please use [facility] for transfer back to facility as they contract this service for our active hospice patients.  Wallis Bamberg RN, BSN, CCRN [facility] Liaison

## 2019-12-24 LAB — BASIC METABOLIC PANEL
Anion gap: 10 (ref 5–15)
BUN: 13 mg/dL (ref 8–23)
CO2: 27 mmol/L (ref 22–32)
Calcium: 8.6 mg/dL — ABNORMAL LOW (ref 8.9–10.3)
Chloride: 97 mmol/L — ABNORMAL LOW (ref 98–111)
Creatinine, Ser: 1 mg/dL (ref 0.44–1.00)
GFR calc Af Amer: 59 mL/min — ABNORMAL LOW (ref >60)
GFR calc non Af Amer: 51 mL/min — ABNORMAL LOW (ref >60)
Glucose, Bld: 281 mg/dL — ABNORMAL HIGH (ref 70–99)
Potassium: 3.8 mmol/L (ref 3.5–5.1)
Sodium: 134 mmol/L — ABNORMAL LOW (ref 135–145)

## 2019-12-24 LAB — MAGNESIUM: Magnesium: 1.7 mg/dL (ref 1.7–2.4)

## 2019-12-24 LAB — SARS CORONAVIRUS 2 BY RT PCR (HOSPITAL ORDER, PERFORMED IN ~~LOC~~ HOSPITAL LAB): SARS Coronavirus 2: NEGATIVE

## 2019-12-24 MED ORDER — MORPHINE SULFATE (CONCENTRATE) 10 MG /0.5 ML PO SOLN
5.0000 mg | ORAL | 0 refills | Status: AC | PRN
Start: 2019-12-24 — End: ?

## 2019-12-24 MED ORDER — LORAZEPAM 0.5 MG PO TABS: 0.5000 mg | ORAL_TABLET | Freq: Four times a day (QID) | ORAL | 0 refills | Status: DC | PRN

## 2019-12-24 MED ORDER — MORPHINE SULFATE (CONCENTRATE) 10 MG /0.5 ML PO SOLN
5.0000 mg | ORAL | 0 refills | Status: DC | PRN
Start: 2019-12-24 — End: 2019-12-24

## 2019-12-24 MED ORDER — LORAZEPAM 0.5 MG PO TABS: 0.5000 mg | ORAL_TABLET | Freq: Four times a day (QID) | ORAL | 0 refills | Status: AC | PRN

## 2019-12-24 MED ORDER — MORPHINE SULFATE (PF) 2 MG/ML IV SOLN
2.0000 mg | Freq: Once | INTRAVENOUS | Status: AC
  Administered 2019-12-24: 2 mg via INTRAVENOUS
  Filled 2019-12-24: qty 1

## 2019-12-24 NOTE — Progress Notes (Signed)
Spoke and gave report to Toni Amend, Charity fundraiser, nurse at [facility] at [phone]. RN verbalizes no further questions.

## 2019-12-24 NOTE — Plan of Care (Signed)

## 2019-12-24 NOTE — TOC Transition Note (Signed)
Transition of Care [facility]) - CM/SW Discharge Note   Patient Details  Name: Lindsey [name] MRN: [id number] Date of Birth: 11-Nov-1930  Transition of Care [facility]) CM/SW Contact:  Carmina Miller, LCSWA Phone Number: 12/24/2019, 3:49 PM   Clinical Narrative:    Patient will DC to: [place] [facility] Anticipated DC date:  12/24/19 Family notified: Haynes Bast Transport by: Alyssa Grove   Per MD patient ready for DC to [facility] . RN to call report prior to discharge [phone]. RN, patient, patient's family, and facility notified of DC. Discharge Summary and FL2 sent to facility. DC packet on chart. Ambulance transport requested for patient.   CSW will sign off for now as social work intervention is no longer needed. Please consult [place] again if new needs arise.     Final next level of care: Assisted Living Barriers to Discharge: Barriers Resolved   Patient Goals and CMS Choice Patient states their goals for this hospitalization and ongoing recovery are:: Get back home CMS Medicare.gov Compare Post Acute Care list provided to:: Patient Represenative (must comment) Haynes Bast, daughter) Choice offered to / list presented to : Adult Children  Discharge Placement                       Discharge Plan and Services In-house Referral: Clinical Social Work                                   Social Determinants of Health (SDOH) Interventions     Readmission Risk Interventions Readmission Risk Prevention Plan 11/26/2019  Transportation Screening Complete  PCP or Specialist Appt within 3-5 Days Complete  HRI or Home Care Consult Complete  Social Work Consult for Recovery Care Planning/Counseling Complete  Palliative Care Screening Not Applicable  Medication Review Oceanographer) Complete

## 2019-12-24 NOTE — Progress Notes (Signed)
   12/24/19 0836  Assess: MEWS Score  Temp 98.3 F (36.8 C)  BP (!) 113/57  Pulse Rate (!) 112  Resp 16  Level of Consciousness Alert  SpO2 100 %  O2 Device Nasal Cannula  O2 Flow Rate (L/min) 2 L/min  Assess: MEWS Score  MEWS Temp 0  MEWS Systolic 0  MEWS Pulse 2  MEWS RR 0  MEWS LOC 0  MEWS Score 2  MEWS Score Color Yellow  Assess: if the MEWS score is Yellow or Red  Were vital signs taken at a resting state? Yes  Focused Assessment No change from prior assessment  Early Detection of Sepsis Score *See Row Information* Low  MEWS guidelines implemented *See Row Information* No, other (Comment) (Patient is A Fib patient HR is at baseline)  Treat  MEWS Interventions Administered scheduled meds/treatments  Pain Scale 0-10  Pain Score 0   Patient is alert and oriented x 4. Patient verbalizes no shortness of breathe. Patient was admitted with an elevated HR. Patient is a chronic yellow MEWS. Patient vitals was taken at rest and MD is aware of patient sinus tach. Will continue to monitor for changes.

## 2019-12-24 NOTE — NC FL2 (Signed)
[place] MEDICAID FL2 LEVEL OF CARE SCREENING TOOL     IDENTIFICATION  Patient Name: Lindsey [name] Birthdate: Sep 23, 1930 Sex: female Admission Date (Current Location): 12/22/2019  [facility] and [place] Number:  Producer, television/film/video and Address:  [facility], [address], [place], [place] [address]      Provider Number: [id number]  Attending Physician Name and Address:  Calvert Cantor, MD  Relative Name and Phone Number:       Current Level of Care: Hospital Recommended Level of Care: Assisted Living Facility Prior Approval Number:    Date Approved/Denied:   PASRR Number:    Discharge Plan: Other (Comment) (ALF)    Current Diagnoses: Patient Active Problem List   Diagnosis Date Noted  . Hyperlipidemia   . Normocytic anemia   . Hypokalemia   . CKD (chronic kidney disease), stage III (HCC)   . Acute on chronic systolic CHF (congestive heart failure) (HCC)   . Paroxysmal A-fib (HCC) 11/27/2019  . Palliative care by specialist   . Goals of care, counseling/discussion   . Terminal care   . Sepsis (HCC)   . COPD with acute exacerbation (HCC) 11/25/2019  . Chest pain 11/23/2019  . Acute CHF (congestive heart failure) (HCC) 11/23/2019  . Unspecified atrial fibrillation (HCC) 11/23/2019  . Chronic anticoagulation 11/23/2019  . Acute lower UTI 11/23/2019  . DNR (do not resuscitate) 11/23/2019    Orientation RESPIRATION BLADDER Height & Weight     Self, Time, Situation, Place  O2 (Nasal cannula) Incontinent Weight: 73.7 kg Height:  5\' 7"  (170.2 cm)  BEHAVIORAL SYMPTOMS/MOOD NEUROLOGICAL BOWEL NUTRITION STATUS      Continent Diet  As tolerated  AMBULATORY STATUS COMMUNICATION OF NEEDS Skin   Limited Assist Verbally Normal                       Personal Care Assistance Level of Assistance  Bathing, Feeding, Dressing Bathing Assistance: Limited assistance Feeding assistance: Independent Dressing Assistance: Limited assistance      Functional Limitations Info             SPECIAL CARE FACTORS FREQUENCY                       Contractures Contractures Info: Not present    Additional Factors Info  Code Status, Allergies, Psychotropic Code Status Info: DNR Allergies Info: Diazepam, Drug Ingredient (Monosodium Glutamate), Iodinated Diagnostic Agents, Nitrofurantoin, Pineapple, Savella (Milnacipran), Shellfish Allergy, Sulfa Antibiotics, Tramadol Psychotropic Info: Cymbalta          Discharge Medications: TAKE these medications   acetaminophen 325 MG tablet Commonly known as: TYLENOL Take 325 mg by mouth in the morning, at noon, and at bedtime.   amiodarone 200 MG tablet Commonly known as: PACERONE Take 1 tablet (200 mg total) by mouth daily.   aspirin 81 MG chewable tablet Chew 81 mg by mouth every morning.   atorvastatin 10 MG tablet Commonly known as: LIPITOR Take 10 mg by mouth every morning.   diclofenac Sodium 1 % Gel Commonly known as: VOLTAREN Apply 2 g topically daily as needed (pain).   docusate sodium 100 MG capsule Commonly known as: COLACE Take 100 mg by mouth every other day.   DULoxetine 60 MG capsule Commonly known as: CYMBALTA Take 60 mg by mouth every morning.   furosemide 40 MG tablet Commonly known as: LASIX Take 40 mg by mouth every morning.   gabapentin 100 MG capsule Commonly  known as: NEURONTIN Take 100-300 mg by mouth See admin instructions. Give 1 capsule (100 mg) at 0800 and 1400, and give 3 capsules (300 mg) by mouth at bedtime at 2000.   LORazepam 0.5 MG tablet Commonly known as: Ativan Take 1 tablet (0.5 mg total) by mouth every 6 (six) hours as needed for anxiety or sleep.   meclizine 12.5 MG tablet Commonly known as: ANTIVERT Take 12.5 mg by mouth daily. Give one tablet by mouth in the morning for dizziness   meclizine 12.5 MG tablet Commonly known as: ANTIVERT Take 12.5 mg by mouth 2 (two) times daily as needed for dizziness.  Give one tablet by mouth every 12 hours as needed for dizziness give at least 4 hours after AM scheduled dose   morphine CONCENTRATE 10 mg / 0.5 ml concentrated solution Take 0.25 mLs (5 mg total) by mouth every 4 (four) hours as needed for severe pain or shortness of breath.   multivitamin with minerals Tabs tablet Take 1 tablet by mouth every morning.   OXYGEN Inhale 3 L into the lungs continuous. Continuous oxygen at 3L/minute via [place] every shift for CHF   pantoprazole 40 MG tablet Commonly known as: PROTONIX Take 40 mg by mouth every morning.   potassium chloride 10 MEQ tablet Commonly known as: KLOR-CON Take 10 mEq by mouth every Monday, Wednesday, and Friday.   rivaroxaban 20 MG Tabs tablet Commonly known as: XARELTO Take 20 mg by mouth every morning.   SUMAtriptan 50 MG tablet Commonly known as: IMITREX Take 50 mg by mouth every 2 (two) hours as needed for migraine.   tamsulosin 0.4 MG Caps capsule Commonly known as: FLOMAX Take 0.4 mg by mouth every morning.   tiZANidine 2 MG tablet Commonly known as: ZANAFLEX Take 2 mg by mouth every morning.      Relevant Imaging Results:  Relevant Lab Results:   Additional Information ssn: [id number]. Active with [facility]  Leone Haven, RN

## 2019-12-24 NOTE — Discharge Summary (Addendum)
Physician Discharge Summary  Lindsey [name] GOT:[id number] DOB: [birth year] DOA: 12/22/2019  PCP: Patient, No Pcp Per  Admit date: 12/22/2019 Discharge date: 12/24/2019  Admitted From: ALF Disposition:  ALF   Recommendations for Outpatient Follow-up:  1. Discharging back to ALF with hospice (second time) 2. PRN Morphine ordered for air hunger 3. PRN Ativan ordered for anxiety     Discharge Condition:  stable   CODE STATUS:  DNR   Diet recommendation:  Diet as tolerated Consultations:  none  Procedures/Studies: . none   Discharge Diagnoses:  Principal Problem:   Acute on chronic systolic CHF (congestive heart failure) (HCC) Active Problems:   Paroxysmal A-fib (HCC)   Hyperlipidemia   Normocytic anemia   Hypokalemia     Brief Summary: Lindsey [name] is a 86 y.o.femalewith medical history significant ofhypertension, hyperlipidemia, paroxysmal A. fib-on Xarelto, COPD, chronic CHF with ejection fraction of 40 to 45% presents to emergency department for evaluation of shortness of breath and leg swelling for 3 days. She was found to have A-fib with RVR when EMS arrived. Cardizem 10 mg IV was given by EMS  ED Course:Upon arrival to ED: Patient tachycardic, tachypneic, hypoxic requiring 4 L of oxygen via nasal cannula, afebrile with no leukocytosis, BMP shows potassium of 3.1, BNP: 727, COVID-19 negative, chest x-ray shows: Mild acute pulmonary interstitial edema. Small bilateral pleural effusion persist and may have increased since 11/27/2019. Stable cardiomegaly. Chronic hypoventilation of left lung base. Patient was given Lasix 40 IV once in ED. [facility] consulted for admission due to acute on chronic systolic CHF  Hospital Course:  Principal Problem:   Acute on chronic systolic CHF (congestive heart failure) - A-fib with RVR is contributing to this- previously was becoming very bradycardic on B blockers- she declined a discussion with EP about a pacemaker last  admission and opted for comfort care and hospice - Sent into hospital by ALF facility at night- family and hospice were not called - ED called daughter but no response  - the patient has been diuresed awith IV Lasix- she is to continue Lasix as outpt but also use Morphine PRN for air hunger.     Active Problems:   Paroxysmal A-fib  - cont low dose Amiodarone and Xarelto - RVR will continue to be an issue with her- when rate is adequately controlled, she develops sinus pauses and severe bradycardia (tachy/brady syndrome)  - on the 9/3 admission, she declined a pacemaker and opted for hospice  Anxiety - related to dyspnea- start PRN Ativan    Normocytic anemia - due to chronic disease    Hypokalemia - replaced    Discharge Exam: Vitals:   12/24/19 0836 12/24/19 1129  BP: (!) 113/57 108/68  Pulse: (!) 112   Resp: 16   Temp: 98.3 F (36.8 C) 98.4 F (36.9 C)  SpO2: 100% 96%   Vitals:   12/24/19 0345 12/24/19 0741 12/24/19 0836 12/24/19 1129  BP: 121/79 133/73 (!) 113/57 108/68  Pulse: (!) 108 (!) 108 (!) 112   Resp: 20 20 16    Temp: 98.6 F (37 C) 98.4 F (36.9 C) 98.3 F (36.8 C) 98.4 F (36.9 C)  TempSrc: Oral Oral Oral Oral  SpO2: 96% 96% 100% 96%  Weight:      Height:        General: Pt is alert, awake, not in acute distress Cardiovascular: RRR, S1/S2 +, no rubs, no gallops Respiratory: CTA bilaterally, no wheezing, no rhonchi Abdominal: Soft, NT, ND, bowel sounds + Extremities:  no edema, no cyanosis   Discharge Instructions   Allergies as of 12/24/2019      Reactions   Diazepam    Unknown reaction   Drug Ingredient [monosodium Glutamate]    MSG - Unknown reaction   Iodinated Diagnostic Agents    Unknown reaction   Nitrofurantoin    Unknown reaction   Pineapple    Unknown reaction   Savella [milnacipran]    Unknown reaction   Shellfish Allergy    Unknown reaction   Sulfa Antibiotics    Unknown reaction   Tramadol    Unknown reaction       Medication List    TAKE these medications   acetaminophen 325 MG tablet Commonly known as: TYLENOL Take 325 mg by mouth in the morning, at noon, and at bedtime.   amiodarone 200 MG tablet Commonly known as: PACERONE Take 1 tablet (200 mg total) by mouth daily.   aspirin 81 MG chewable tablet Chew 81 mg by mouth every morning.   atorvastatin 10 MG tablet Commonly known as: LIPITOR Take 10 mg by mouth every morning.   diclofenac Sodium 1 % Gel Commonly known as: VOLTAREN Apply 2 g topically daily as needed (pain).   docusate sodium 100 MG capsule Commonly known as: COLACE Take 100 mg by mouth every other day.   DULoxetine 60 MG capsule Commonly known as: CYMBALTA Take 60 mg by mouth every morning.   furosemide 40 MG tablet Commonly known as: LASIX Take 40 mg by mouth every morning.   gabapentin 100 MG capsule Commonly known as: NEURONTIN Take 100-300 mg by mouth See admin instructions. Give 1 capsule (100 mg) at 0800 and 1400, and give 3 capsules (300 mg) by mouth at bedtime at 2000.   LORazepam 0.5 MG tablet Commonly known as: Ativan Take 1 tablet (0.5 mg total) by mouth every 6 (six) hours as needed for anxiety or sleep.   meclizine 12.5 MG tablet Commonly known as: ANTIVERT Take 12.5 mg by mouth daily. Give one tablet by mouth in the morning for dizziness   meclizine 12.5 MG tablet Commonly known as: ANTIVERT Take 12.5 mg by mouth 2 (two) times daily as needed for dizziness. Give one tablet by mouth every 12 hours as needed for dizziness give at least 4 hours after AM scheduled dose   morphine CONCENTRATE 10 mg / 0.5 ml concentrated solution Take 0.25 mLs (5 mg total) by mouth every 4 (four) hours as needed for severe pain or shortness of breath.   multivitamin with minerals Tabs tablet Take 1 tablet by mouth every morning.   OXYGEN Inhale 3 L into the lungs continuous. Continuous oxygen at 3L/minute via [place] every shift for CHF   pantoprazole 40 MG  tablet Commonly known as: PROTONIX Take 40 mg by mouth every morning.   potassium chloride 10 MEQ tablet Commonly known as: KLOR-CON Take 10 mEq by mouth every Monday, Wednesday, and Friday.   rivaroxaban 20 MG Tabs tablet Commonly known as: XARELTO Take 20 mg by mouth every morning.   SUMAtriptan 50 MG tablet Commonly known as: IMITREX Take 50 mg by mouth every 2 (two) hours as needed for migraine.   tamsulosin 0.4 MG Caps capsule Commonly known as: FLOMAX Take 0.4 mg by mouth every morning.   tiZANidine 2 MG tablet Commonly known as: ZANAFLEX Take 2 mg by mouth every morning.       Allergies  Allergen Reactions  . Diazepam     Unknown reaction  .  Drug Ingredient [Monosodium Glutamate]     MSG - Unknown reaction  . Iodinated Diagnostic Agents     Unknown reaction  . Nitrofurantoin     Unknown reaction  . Pineapple     Unknown reaction  . Savella [Milnacipran]     Unknown reaction  . Shellfish Allergy     Unknown reaction  . Sulfa Antibiotics     Unknown reaction  . Tramadol     Unknown reaction      DG CHEST PORT 1 VIEW  Result Date: 12/22/2019 CLINICAL DATA:  Shortness of breath EXAM: PORTABLE CHEST 1 VIEW COMPARISON:  Radiograph 12/22/2019 FINDINGS: Increasing patchy opacities in the right perihilar and infrahilar lung. Small bilateral pleural effusions are again noted. No pneumothorax. Pulmonary vascularity remains congested. Cardiomegaly appearance is unchanged from prior with visible coronary stents. Aorta is calcified. Remaining cardiomediastinal contours are unremarkable. No acute osseous or soft tissue abnormality. Degenerative changes are present in the imaged spine and shoulders. Telemetry leads overlie the chest. IMPRESSION: 1. Likely worsening interstitial and developing alveolar edema though underlying infection is not fully excluded. 2. Stable small bilateral pleural effusions. 3. Stable cardiomegaly and pulmonary vascular congestion. 4.  Aortic  Atherosclerosis (ICD10-I70.0). Electronically Signed   By: Kreg Shropshire M.D.   On: 12/22/2019 21:39   DG Chest Port 1 View  Result Date: 12/22/2019 CLINICAL DATA:  86 year old female with shortness of breath this morning with tachycardia being treated with IV Cardizem. EXAM: PORTABLE CHEST 1 VIEW COMPARISON:  Portable chest 11/27/2019 and earlier. FINDINGS: Portable AP upright view at 0528 hours. Stable cardiomegaly and left lung base hypo ventilation. Small bilateral pleural effusions demonstrated by CT in August likely persist, and are more apparent than on 11/27/2019. Interval increased pulmonary vascularity compatible with mild interstitial edema. No pneumothorax. Visualized tracheal air column is within normal limits. Negative visible bowel gas pattern. No acute osseous abnormality identified. IMPRESSION: 1. Mild acute pulmonary interstitial edema. 2. Small bilateral pleural effusions persist, and may have increased since 11/27/2019. 3. Stable cardiomegaly and chronic hypo ventilation at the left lung base. Electronically Signed   By: Odessa Fleming M.D.   On: 12/22/2019 05:39   DG CHEST PORT 1 VIEW  Result Date: 11/27/2019 CLINICAL DATA:  Shortness of breath, unable to fully arose from sleep in order to obtain history. Unable to remove necklace. EXAM: PORTABLE CHEST 1 VIEW.  The patient is rotated. COMPARISON:  Chest x-ray 11/26/2019, CT chest 11/24/2019 FINDINGS: Similar-appearing enlarged cardiac silhouette. Otherwise the mediastinal contours are unchanged. Aortic arch and descending thoracic aorta calcifications. Low lung volumes. Slight interval decrease in interstitial markings. Similar-appearing retrocardiac opacity. No new focal consolidation. No pulmonary edema. No definite right pleural effusion. Similar-appearing small left pleural effusion. The visualized skeletal structures demonstrate no acute findings. IMPRESSION: Similar-appearing retrocardiac opacity which may represent atelectasis versus  infection/inflammation. Interval improvement of residual mild pulmonary edema. Similar-appearing small left pleural effusion. Interval resolution of right pleural effusion. Electronically Signed   By: Tish Frederickson M.D.   On: 11/27/2019 09:20   DG CHEST PORT 1 VIEW  Result Date: 11/26/2019 CLINICAL DATA:  Dyspnea, rapid response EXAM: PORTABLE CHEST 1 VIEW COMPARISON:  11/24/2019, 11/22/2019 FINDINGS: Single frontal view of the chest demonstrates a stable cardiac silhouette. There is increased central vascular congestion and interstitial prominence, with progressive left basilar consolidation. Small bilateral pleural effusions are noted. No pneumothorax. No acute bony abnormalities. IMPRESSION: 1. Worsening volume status, with increased interstitial edema and small bilateral pleural effusions. 2. Increasing left  basilar consolidation may reflect atelectasis. Electronically Signed   By: Sharlet Salina M.D.   On: 11/26/2019 03:01     The results of significant diagnostics from this hospitalization (including imaging, microbiology, ancillary and laboratory) are listed below for reference.     Microbiology: Recent Results (from the past 240 hour(s))  Respiratory Panel by RT PCR (Flu A&B, Covid) - Nasopharyngeal Swab     Status: None   Collection Time: 12/22/19  5:41 AM   Specimen: Nasopharyngeal Swab  Result Value Ref Range Status   SARS Coronavirus 2 by RT PCR NEGATIVE NEGATIVE Final    Comment: (NOTE) SARS-CoV-2 target nucleic acids are NOT DETECTED.  The SARS-CoV-2 RNA is generally detectable in upper respiratoy specimens during the acute phase of infection. The lowest concentration of SARS-CoV-2 viral copies this assay can detect is 131 copies/mL. A negative result does not preclude SARS-Cov-2 infection and should not be used as the sole basis for treatment or other patient management decisions. A negative result may occur with  improper specimen collection/handling, submission of specimen  other than nasopharyngeal swab, presence of viral mutation(s) within the areas targeted by this assay, and inadequate number of viral copies (<131 copies/mL). A negative result must be combined with clinical observations, patient history, and epidemiological information. The expected result is Negative.  Fact Sheet for Patients:  https://www.moore.com/  Fact Sheet for Healthcare Providers:  https://www.young.biz/  This test is no t yet approved or cleared by the [place] FDA and  has been authorized for detection and/or diagnosis of SARS-CoV-2 by FDA under an Emergency Use Authorization (EUA). This EUA will remain  in effect (meaning this test can be used) for the duration of the COVID-19 declaration under Section 564(b)(1) of the Act, 21 U.S.C. section 360bbb-3(b)(1), unless the authorization is terminated or revoked sooner.     Influenza A by PCR NEGATIVE NEGATIVE Final   Influenza B by PCR NEGATIVE NEGATIVE Final    Comment: (NOTE) The Xpert Xpress SARS-CoV-2/FLU/RSV assay is intended as an aid in  the diagnosis of influenza from Nasopharyngeal swab specimens and  should not be used as a sole basis for treatment. Nasal washings and  aspirates are unacceptable for Xpert Xpress SARS-CoV-2/FLU/RSV  testing.  Fact Sheet for Patients: https://www.moore.com/  Fact Sheet for Healthcare Providers: https://www.young.biz/  This test is not yet approved or cleared by the [place] FDA and  has been authorized for detection and/or diagnosis of SARS-CoV-2 by  FDA under an Emergency Use Authorization (EUA). This EUA will remain  in effect (meaning this test can be used) for the duration of the  Covid-19 declaration under Section 564(b)(1) of the Act, 21  U.S.C. section 360bbb-3(b)(1), unless the authorization is  terminated or revoked. Performed at [facility], [address],  [place], [place] [address]      Labs: BNP (last 3 results) Recent Labs    11/23/19 0518 12/22/19 0529  BNP 687.3* 727.6*   Basic Metabolic Panel: Recent Labs  Lab 12/22/19 0529 12/22/19 0743 12/23/19 0115 12/24/19 0942  NA 138  --  137 134*  K 3.1*  --  3.8 3.8  CL 101  --  102 97*  CO2 23  --  24 27  GLUCOSE 185*  --  176* 281*  BUN 12  --  11 13  CREATININE 0.98  --  0.88 1.00  CALCIUM 9.0  --  8.9 8.6*  MG  --  1.5*  --  1.7   Liver  Function Tests: No results for input(s): AST, ALT, ALKPHOS, BILITOT, PROT, ALBUMIN in the last 168 hours. No results for input(s): LIPASE, AMYLASE in the last 168 hours. No results for input(s): AMMONIA in the last 168 hours. CBC: Recent Labs  Lab 12/22/19 0529  WBC 8.5  NEUTROABS 5.6  HGB 10.3*  HCT 33.6*  MCV 89.6  PLT 387   Cardiac Enzymes: No results for input(s): CKTOTAL, CKMB, CKMBINDEX, TROPONINI in the last 168 hours. BNP: Invalid input(s): POCBNP CBG: No results for input(s): GLUCAP in the last 168 hours. D-Dimer Recent Labs    12/23/19 0115  DDIMER 0.93*   Hgb A1c No results for input(s): HGBA1C in the last 72 hours. Lipid Profile No results for input(s): CHOL, HDL, LDLCALC, TRIG, CHOLHDL, LDLDIRECT in the last 72 hours. Thyroid function studies Recent Labs    12/22/19 0743  TSH 3.237   Anemia work up No results for input(s): VITAMINB12, FOLATE, FERRITIN, TIBC, IRON, RETICCTPCT in the last 72 hours. Urinalysis    Component Value Date/Time   COLORURINE YELLOW 11/23/2019 0630   APPEARANCEUR HAZY (A) 11/23/2019 0630   LABSPEC 1.014 11/23/2019 0630   PHURINE 5.0 11/23/2019 0630   GLUCOSEU NEGATIVE 11/23/2019 0630   HGBUR NEGATIVE 11/23/2019 0630   BILIRUBINUR NEGATIVE 11/23/2019 0630   KETONESUR NEGATIVE 11/23/2019 0630   PROTEINUR NEGATIVE 11/23/2019 0630   NITRITE NEGATIVE 11/23/2019 0630   LEUKOCYTESUR NEGATIVE 11/23/2019 0630   Sepsis Labs Invalid input(s): PROCALCITONIN,  WBC,   LACTICIDVEN Microbiology Recent Results (from the past 240 hour(s))  Respiratory Panel by RT PCR (Flu A&B, Covid) - Nasopharyngeal Swab     Status: None   Collection Time: 12/22/19  5:41 AM   Specimen: Nasopharyngeal Swab  Result Value Ref Range Status   SARS Coronavirus 2 by RT PCR NEGATIVE NEGATIVE Final    Comment: (NOTE) SARS-CoV-2 target nucleic acids are NOT DETECTED.  The SARS-CoV-2 RNA is generally detectable in upper respiratoy specimens during the acute phase of infection. The lowest concentration of SARS-CoV-2 viral copies this assay can detect is 131 copies/mL. A negative result does not preclude SARS-Cov-2 infection and should not be used as the sole basis for treatment or other patient management decisions. A negative result may occur with  improper specimen collection/handling, submission of specimen other than nasopharyngeal swab, presence of viral mutation(s) within the areas targeted by this assay, and inadequate number of viral copies (<131 copies/mL). A negative result must be combined with clinical observations, patient history, and epidemiological information. The expected result is Negative.  Fact Sheet for Patients:  https://www.moore.com/  Fact Sheet for Healthcare Providers:  https://www.young.biz/  This test is no t yet approved or cleared by the [place] FDA and  has been authorized for detection and/or diagnosis of SARS-CoV-2 by FDA under an Emergency Use Authorization (EUA). This EUA will remain  in effect (meaning this test can be used) for the duration of the COVID-19 declaration under Section 564(b)(1) of the Act, 21 U.S.C. section 360bbb-3(b)(1), unless the authorization is terminated or revoked sooner.     Influenza A by PCR NEGATIVE NEGATIVE Final   Influenza B by PCR NEGATIVE NEGATIVE Final    Comment: (NOTE) The Xpert Xpress SARS-CoV-2/FLU/RSV assay is intended as an aid in  the diagnosis of  influenza from Nasopharyngeal swab specimens and  should not be used as a sole basis for treatment. Nasal washings and  aspirates are unacceptable for Xpert Xpress SARS-CoV-2/FLU/RSV  testing.  Fact Sheet for Patients: https://www.moore.com/  Fact  Sheet for Healthcare Providers: https://www.young.biz/https://www.fda.gov/media/142435/download  This test is not yet approved or cleared by the [place] FDA and  has been authorized for detection and/or diagnosis of SARS-CoV-2 by  FDA under an Emergency Use Authorization (EUA). This EUA will remain  in effect (meaning this test can be used) for the duration of the  Covid-19 declaration under Section 564(b)(1) of the Act, 21  U.S.C. section 360bbb-3(b)(1), unless the authorization is  terminated or revoked. Performed at [facility], [address] [place], [place] [id number]      Time coordinating discharge in minutes: 65  SIGNED:   Calvert CantorSaima Undra Trembath, MD  [facility] 12/24/2019, 1:39 PM

## 2019-12-24 NOTE — Progress Notes (Signed)
D/C instructions printed and placed in packet at nurse's station for PTAR. Tele and IV removed, tolerated well. 

## 2020-01-13 ENCOUNTER — Ambulatory Visit: Payer: Medicare Other | Admitting: Podiatry

## 2020-01-25 DEATH — deceased

## 2022-06-11 IMAGING — DX DG CHEST 1V PORT
1 series · 1 of 1 positions shown · non-contrast
Comparison: None.

CLINICAL DATA: Fever, lethargy

EXAM:
PORTABLE CHEST 1 VIEW

[chest ap]
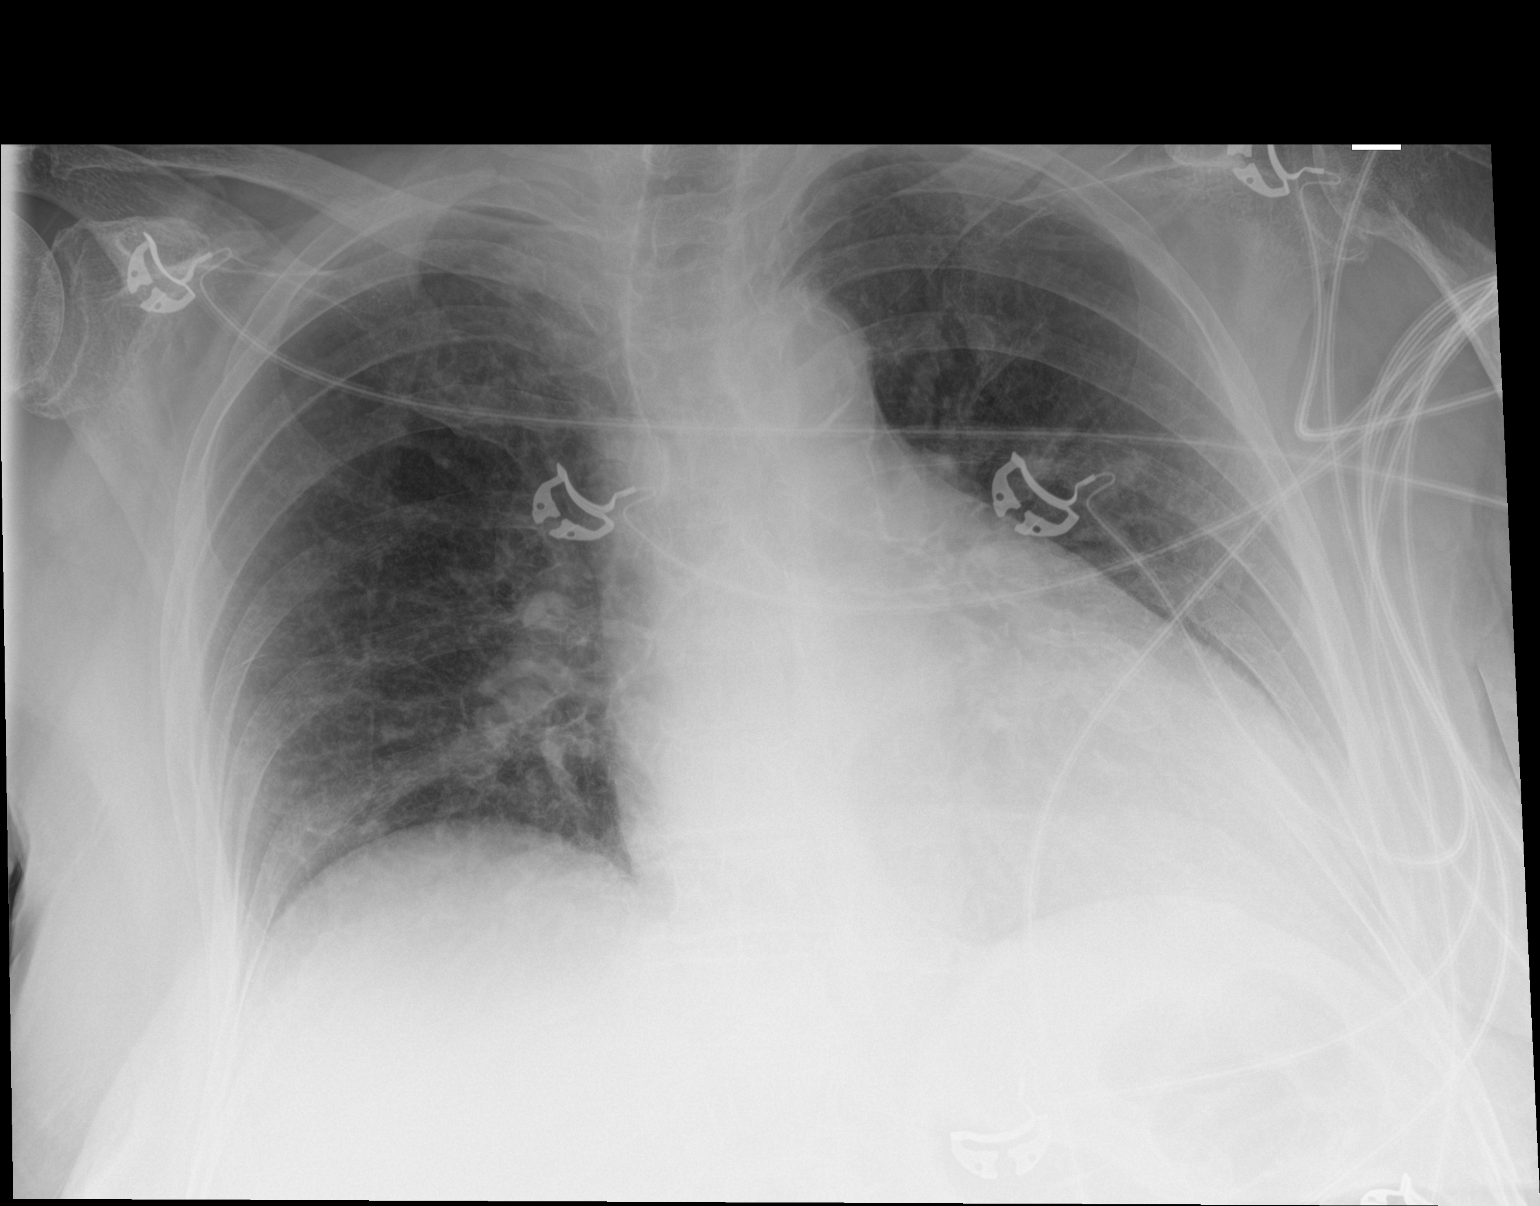

[1 of 1 positions shown; findings below may reference images not displayed]

FINDINGS: Single frontal view of the chest demonstrates an unremarkable
cardiac silhouette. No airspace disease, effusion, or pneumothorax.
No acute bony abnormalities.
IMPRESSION: 1. No acute intrathoracic process.
